# Patient Record
Sex: Male | Born: 1960 | Race: Black or African American | Hispanic: No | Marital: Married | Smoking: Former smoker
Health system: Southern US, Community
[De-identification: ages and names within clinical notes are randomized; demographics above are authoritative.]

## PROBLEM LIST (undated history)

## (undated) DIAGNOSIS — I10 Essential (primary) hypertension: Secondary | ICD-10-CM

## (undated) HISTORY — PX: SHOULDER SURGERY: SHX246

## (undated) HISTORY — PX: BACK SURGERY: SHX140

## (undated) HISTORY — PX: TONSILLECTOMY: SUR1361

---

## 2004-09-26 ENCOUNTER — Ambulatory Visit (HOSPITAL_BASED_OUTPATIENT_CLINIC_OR_DEPARTMENT_OTHER): Admission: RE | Admit: 2004-09-26 | Discharge: 2004-09-26 | Payer: Self-pay | Admitting: Orthopedic Surgery

## 2004-09-26 ENCOUNTER — Ambulatory Visit (HOSPITAL_COMMUNITY): Admission: RE | Admit: 2004-09-26 | Discharge: 2004-09-26 | Payer: Self-pay | Admitting: Orthopedic Surgery

## 2005-11-07 ENCOUNTER — Encounter: Admission: RE | Admit: 2005-11-07 | Discharge: 2005-11-07 | Payer: Self-pay | Admitting: Orthopedic Surgery

## 2005-11-19 ENCOUNTER — Inpatient Hospital Stay (HOSPITAL_COMMUNITY): Admission: RE | Admit: 2005-11-19 | Discharge: 2005-11-21 | Payer: Self-pay | Admitting: Orthopedic Surgery

## 2007-06-02 IMAGING — CR DG CHEST 2V
2 series · 2 of 2 positions shown · non-contrast
Comparison: none

CLINICAL DATA: Preadmission workup for HNP.  Hypertension.  
 CHEST - 2 VIEWS ? 11/15/05: 
 No priors for comparison.

[view not recorded (1 of 2)]
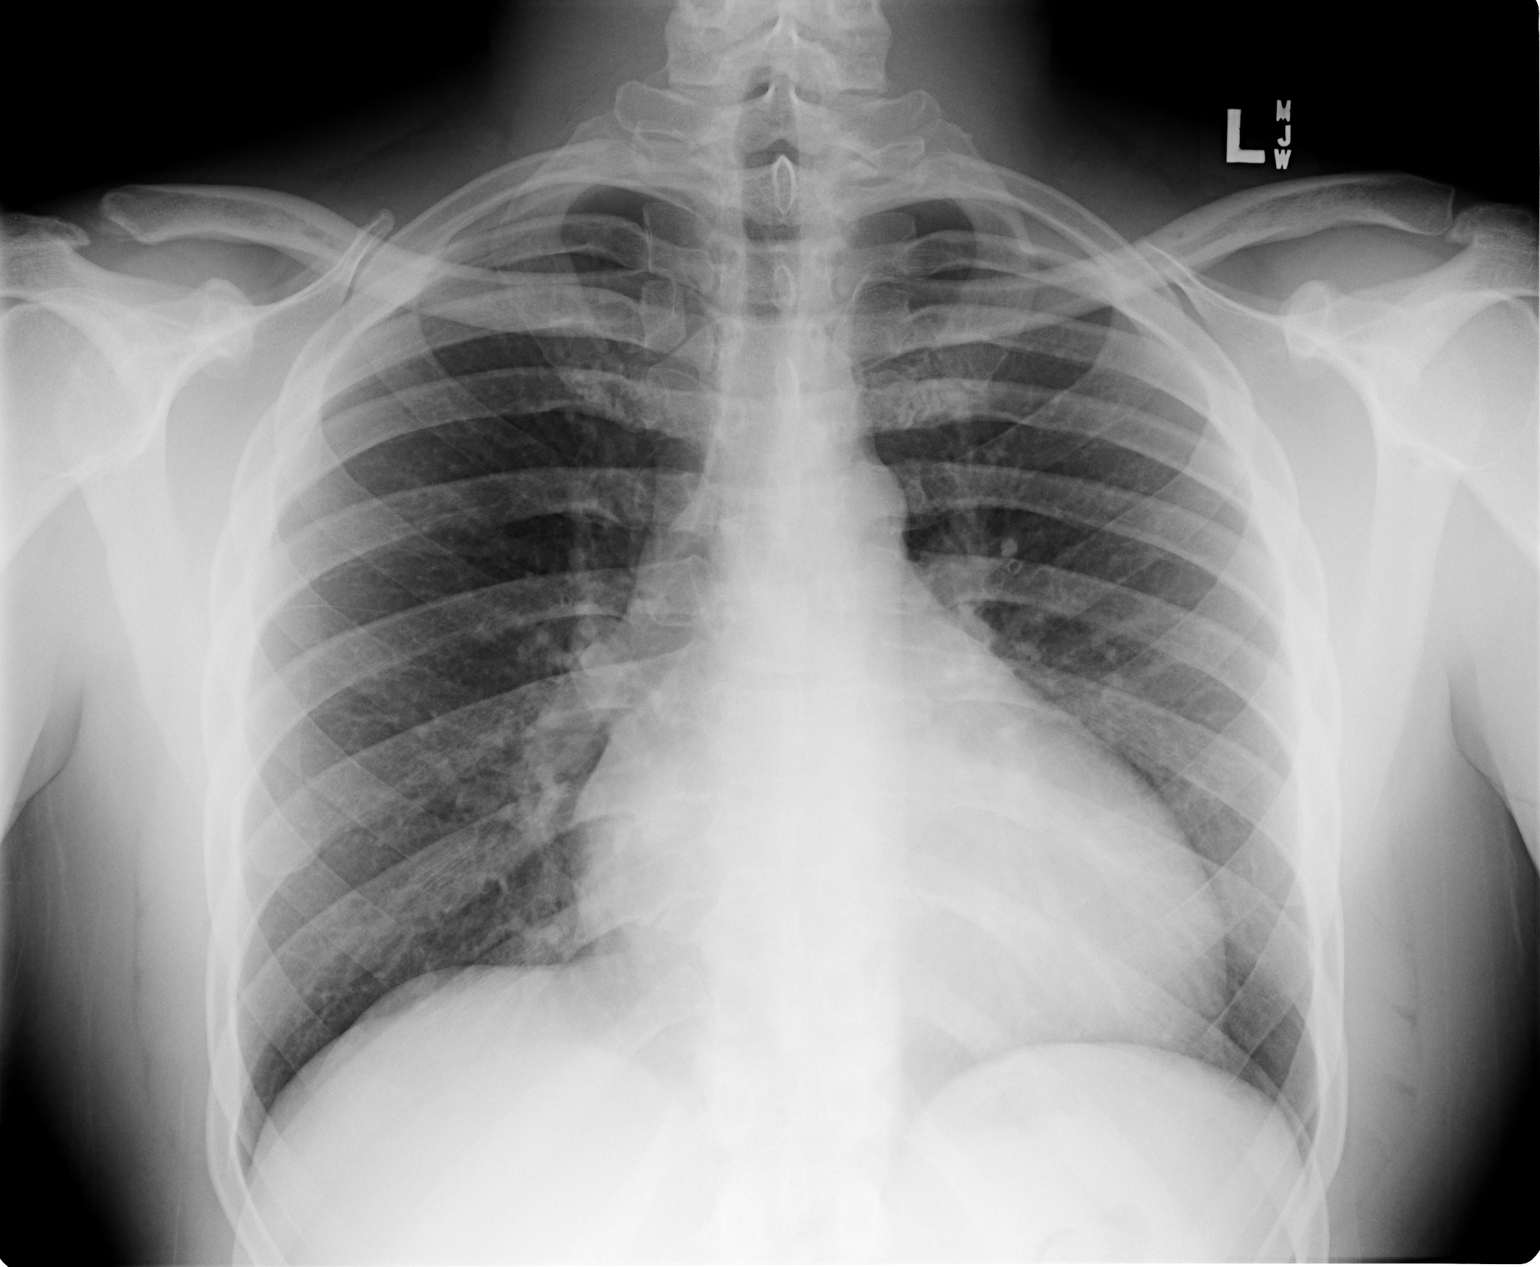

[view not recorded (2 of 2)]
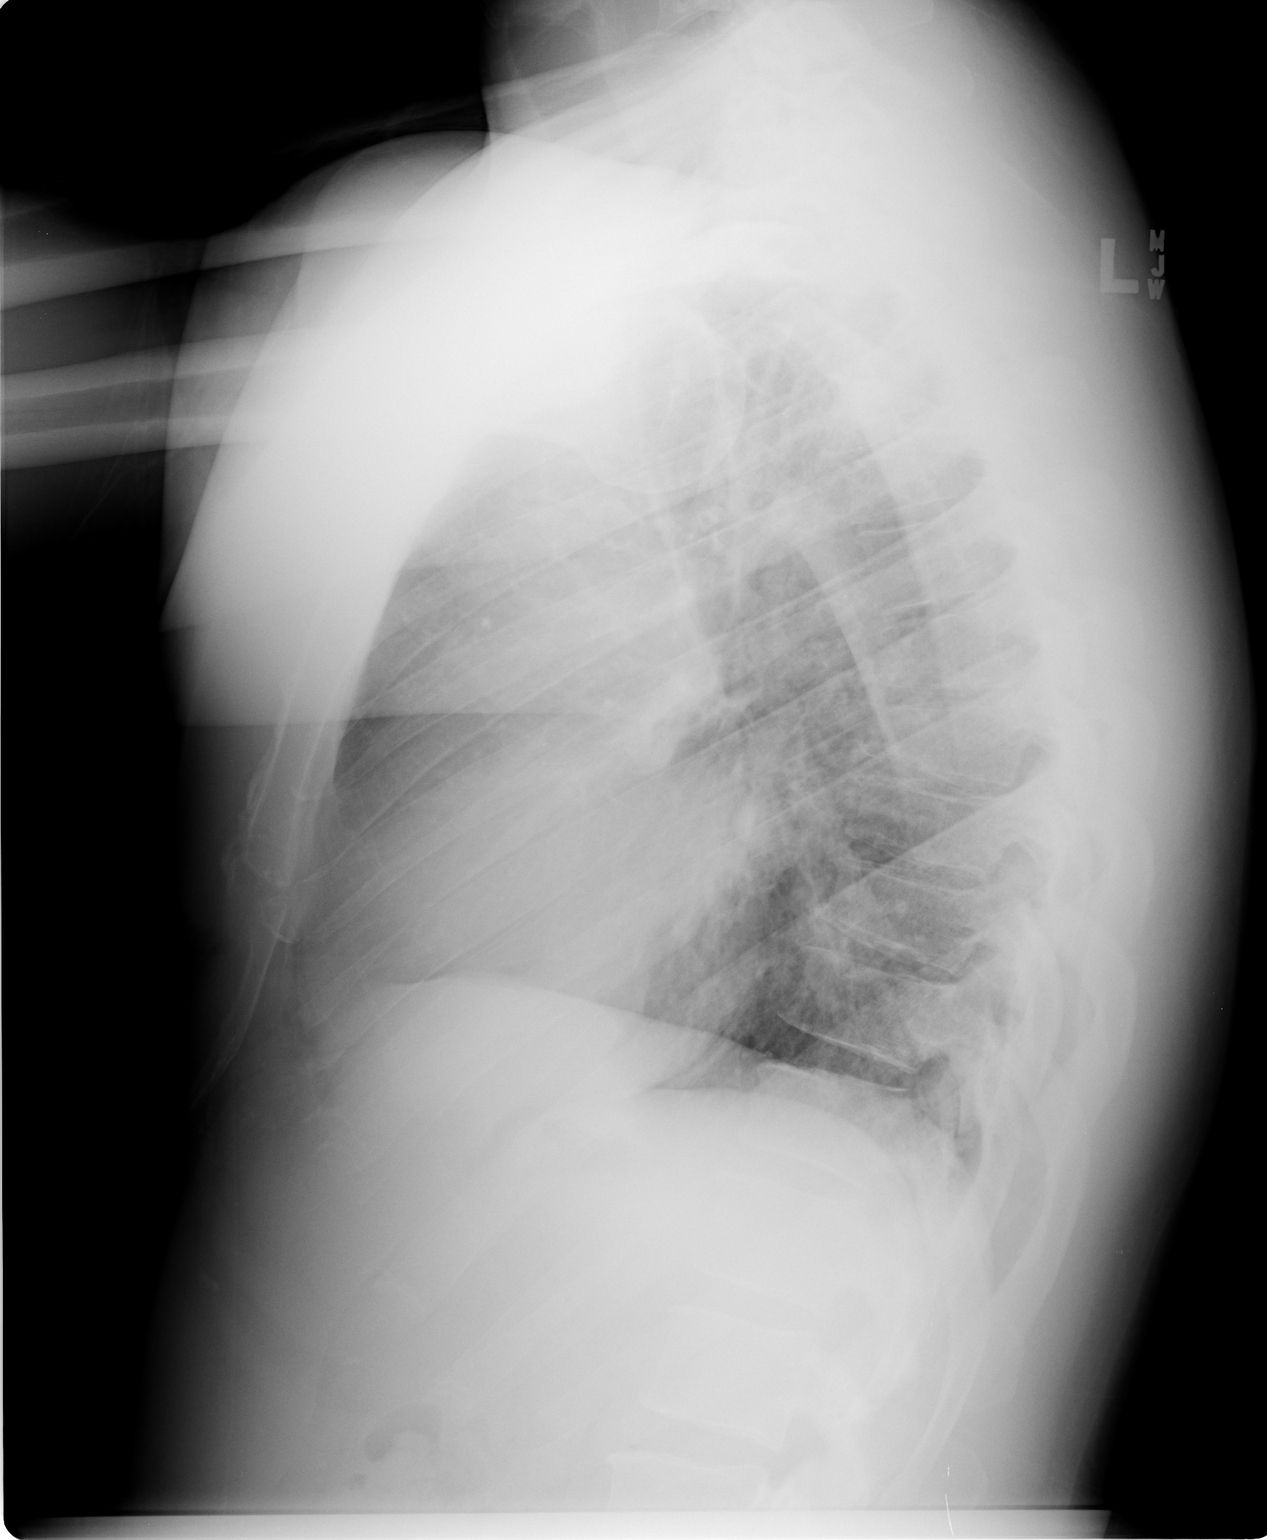

[2 of 2 positions shown; findings below may reference images not displayed]

FINDINGS: Cardiomegaly.  No congestive heart failure or acute lung process.
IMPRESSION: Cardiomegaly.

## 2007-06-06 IMAGING — CR DG LUMBAR SPINE 2-3V
1 series · 1 of 1 positions shown · non-contrast
Comparison: none

CLINICAL DATA: Localization for discectomy. 
 LUMBAR SPINE ? 2 VIEW:

[view not recorded]
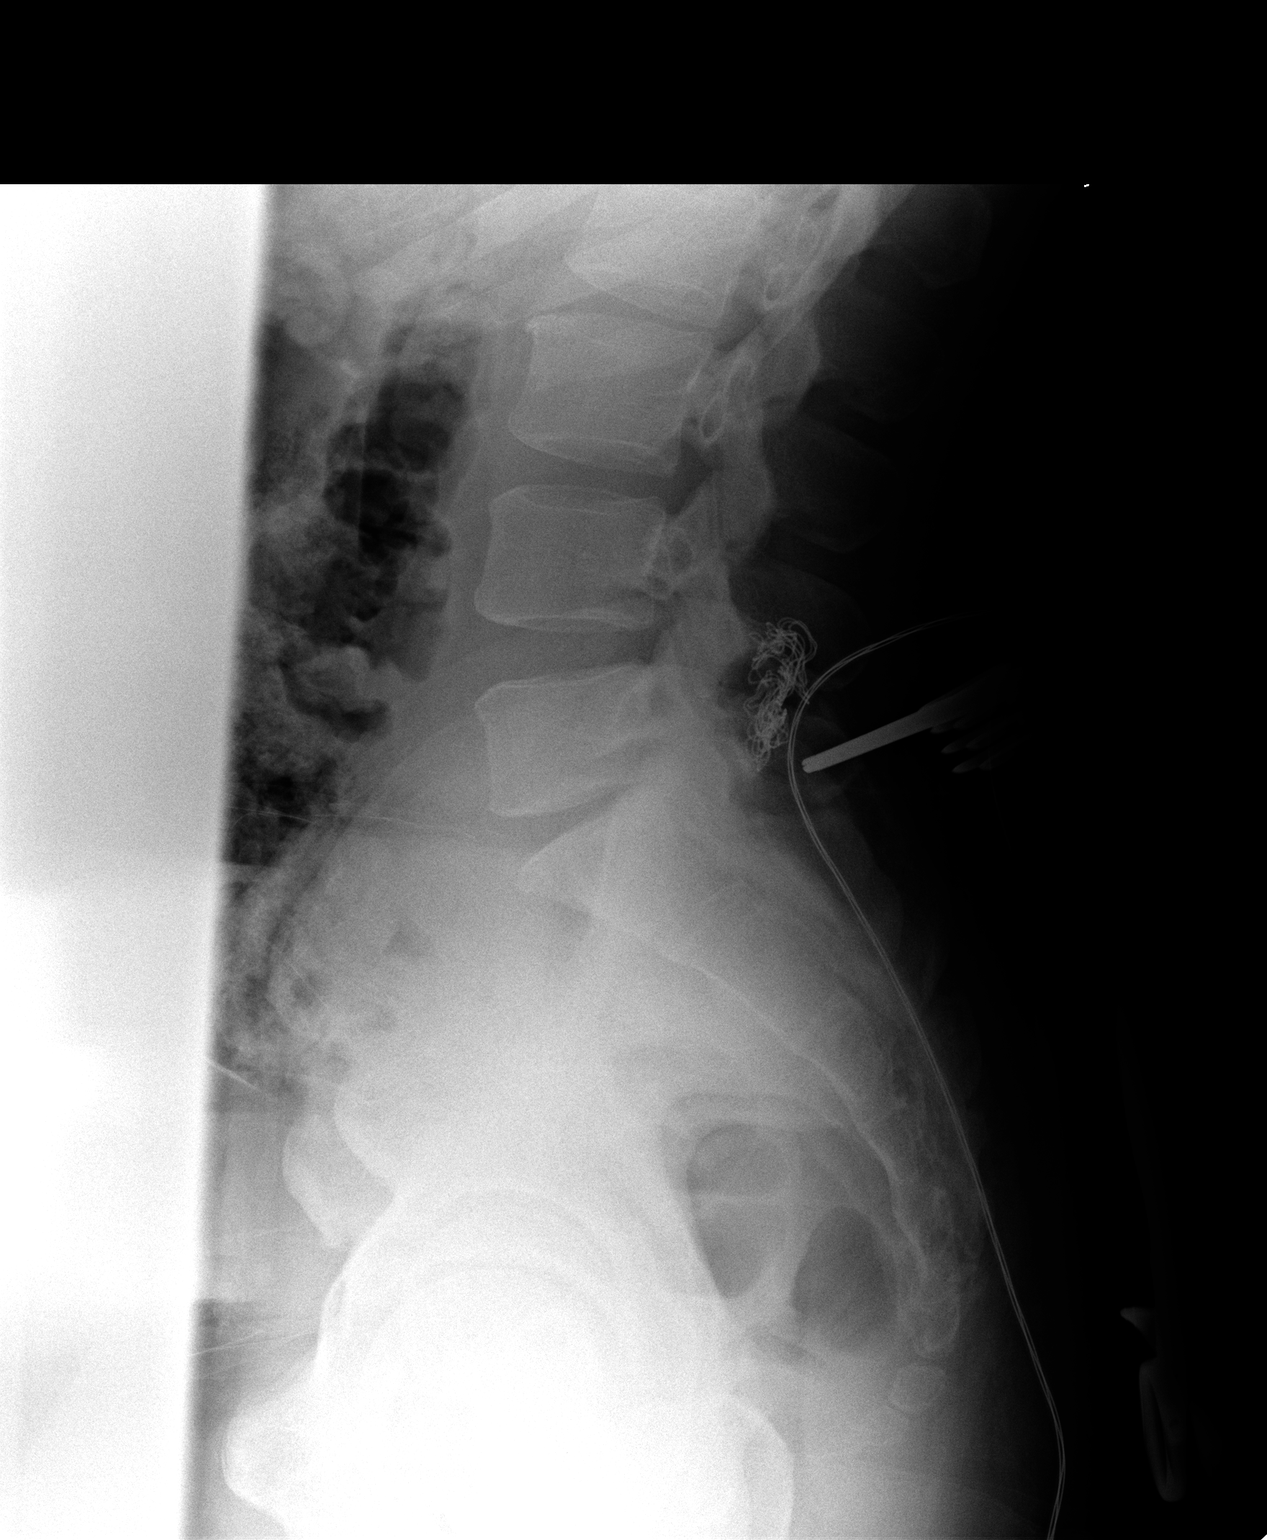

[1 of 1 positions shown; findings below may reference images not displayed]

FINDINGS: Instruments are posterior to the neural canal at L5-S1.
IMPRESSION: Instruments at L5-S1. 
 LUMBAR SPINE #2, 11/19/05, 4141 HOURS:
FINDINGS: A needle overlies the L4-5 disc space near the center.
IMPRESSION: The L4-5 disc space is localized.

## 2017-01-14 ENCOUNTER — Ambulatory Visit: Payer: BLUE CROSS/BLUE SHIELD | Attending: Internal Medicine | Admitting: Physical Therapy

## 2017-01-14 DIAGNOSIS — R293 Abnormal posture: Secondary | ICD-10-CM | POA: Diagnosis present

## 2017-01-14 DIAGNOSIS — M5441 Lumbago with sciatica, right side: Secondary | ICD-10-CM | POA: Diagnosis present

## 2017-01-14 DIAGNOSIS — M5442 Lumbago with sciatica, left side: Secondary | ICD-10-CM | POA: Insufficient documentation

## 2017-01-14 DIAGNOSIS — G8929 Other chronic pain: Secondary | ICD-10-CM | POA: Insufficient documentation

## 2017-01-14 NOTE — Therapy (Signed)
[facility] [address], [address] [place], [place], [address] Phone: [phone]   Fax:  [phone]  Physical Therapy Evaluation  Patient Details  Name: Terrence Newton MRN: [id number] Date of Birth: 30-May-1960 Referring Provider: Dr. Salli Real  Encounter Date: 01/14/2017      PT End of Session - 01/14/17 1229    Visit Number 1   Number of Visits 12   Date for PT Re-Evaluation 02/25/17   Authorization Type [employer] $25 copay; 60 visit limit   PT Start Time 1152   PT Stop Time 1225   PT Time Calculation (min) 33 min   Activity Tolerance Patient tolerated treatment well   Behavior During Therapy Restless      No past medical history on file.  No past surgical history on file.  There were no vitals filed for this visit.       Subjective Assessment - 01/14/17 1155    Subjective Pt is a 56 y/o male who presents to OPPT for chronic LBP, which exacerbated putting pt out of work since November 29, 2016.  Pt declines specific MOI, but reports work responsibilities include bending and lifting.  Pt presents today with continued LBP affecting functional mobility.  pt to bring med list next visit   Pertinent History laminectomy (laser and orthopedic) L5-S1 x 2 (2004, 2007)   Limitations Lifting;House hold activities;Walking;Standing;Sitting   How long can you sit comfortably? unable (pt restless but sitting throughout subjective of eval)   How long can you stand comfortably? "very little"   How long can you walk comfortably? "couple hundred feet"   Diagnostic tests CT scan: DDD, narrowing   Patient Stated Goals improve pain and functional, wants to be able to work for a couple hours   Currently in Pain? Yes   Pain Score 7   up to 9/10; at best 4/10   Pain Location Back   Pain Orientation Lower;Left;Right  "alternates" sides   Pain Type Chronic pain   Pain Radiating Towards into bil LEs into feet (2nd and 3rd toe specifically)   Pain Onset More than a month ago   Pain Frequency Constant   Aggravating Factors  "everything"   Pain Relieving Factors lying down alternating heat, ice; meds            [facility] PT Assessment - 01/14/17 1203      Assessment   Medical Diagnosis LBP   Referring Provider Dr. Salli Real   Onset Date/Surgical Date 11/29/16   Next MD Visit 01/23/17   Prior Therapy previously following back surgeries     Precautions   Precautions None     Restrictions   Weight Bearing Restrictions No     Balance Screen   Has the patient fallen in the past 6 months No   Has the patient had a decrease in activity level because of a fear of falling?  No   Is the patient reluctant to leave their home because of a fear of falling?  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, 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, bending   Leisure fishing     Observation/Other Assessments   Focus on Therapeutic Outcomes (FOTO)  25(75% limited; predicted 54% limited)     Posture/Postural Control   Posture/Postural Control Postural limitations   Postural Limitations Rounded Shoulders;Forward head     ROM / Strength   AROM / PROM / Strength AROM;Strength     AROM   AROM Assessment Site Lumbar   Lumbar Flexion 57  had to "walk" hands back up to stand upright   Lumbar Extension 15   Lumbar - Right Side Bend 14   Lumbar - Left Side Bend 40     Strength    Strength Assessment Site Hip;Knee;Ankle   Right/Left Hip Right;Left   Right Hip Flexion 5/5   Right Hip Extension 4/5  pain   Right Hip ABduction 5/5   Left Hip Flexion 5/5   Left Hip Extension 4/5  pain   Left Hip ABduction 5/5   Right/Left Knee Right;Left   Right Knee Flexion 5/5   Right Knee Extension 5/5   Left Knee Flexion 5/5   Left Knee Extension 5/5   Right/Left Ankle Right;Left   Right Ankle Dorsiflexion 5/5   Left Ankle Dorsiflexion 5/5     Flexibility   Soft Tissue Assessment /Muscle Length yes   Hamstrings min tightness bil   Quadriceps mild tightness bil   Piriformis tightness bil     Palpation   Palpation comment tenderness Lt L4/5; trigger points noted in glut med on Lt     Special Tests    Special Tests Lumbar   Lumbar Tests Straight Leg Raise     Straight Leg Raise   Findings Negative            Objective measurements completed on examination: See above findings.          [facility] Adult PT Treatment/Exercise - 01/14/17 1203      Exercises   Exercises Lumbar     Lumbar Exercises: Stretches   Piriformis Stretch 1 rep;30 seconds   Piriformis Stretch Limitations for HEP instruction     Lumbar Exercises: Sidelying   Other Sidelying Lumbar Exercises sidelying quad stretch x 1 rep for HEP instruction - instructed in sidelying and prone options                PT Education - 01/14/17 1229    Education provided Yes   Education Details HEP, TDN   Person(s) Educated Patient   Methods Explanation;Demonstration;Handout   Comprehension Verbalized understanding;Returned demonstration;Need further instruction             PT Long Term Goals - 01/14/17 1353      PT LONG TERM GOAL #1   Title independent with HEP   Time 6   Period Weeks   Status New   Target Date 02/25/17     PT LONG TERM GOAL #2   Title verbalize understanding of posture/body mechanics to decrease risk of reinjury   Time 6   Period Weeks   Status New    Target Date 02/25/17     PT LONG TERM GOAL #3   Title demonstrate ability to walk > 500' without increase in pain for improved function   Time 6   Period Weeks   Status New   Target Date 02/25/17  PT LONG TERM GOAL #4   Title report pain < 4/10 with activity for improved function   Time 6   Period Weeks   Status New   Target Date 02/25/17     PT LONG TERM GOAL #5   Title perform lumbar flexion without increase in pain for improved mobility and in order to perform job responsibilities   Time 6   Period Weeks   Status New   Target Date 02/25/17                Plan - 01/14/17 1350    Clinical Impression Statement Pt is a 56 y/o male who presents to OPPT for LBP radiating into bil lower extremities.  Pt very restless throughout initial eval with constant movement of bil legs and frequent repositioning.  Pt demonstrates mild strength deficits, and impaired flexibility with poor core stability and pain affecting functional mobilty.  Will benefit from PT to address these deficits.   History and Personal Factors relevant to plan of care: unable to work due to pain   Clinical Presentation Evolving   Clinical Presentation due to: minimal alleviating factors, with most activity aggravating pain; worsening symptoms   Clinical Decision Making Moderate   Rehab Potential Fair   PT Frequency 2x / week   PT Duration 6 weeks   PT Treatment/Interventions ADLs/Self Care Home Management;Cryotherapy;Electrical Stimulation;Moist Heat;Traction;Ultrasound;Balance training;Therapeutic exercise;Therapeutic activities;Functional mobility training;Gait training;Stair training;Patient/family education;Manual techniques;Taping;Dry needling   PT Next Visit Plan possible DN to gluts/piriformis; review HEP, add core and hip ext strengthening   Consulted and Agree with Plan of Care Patient      Patient will benefit from skilled therapeutic intervention in order to improve the following deficits and  impairments:  Impaired flexibility, Postural dysfunction, Decreased range of motion, Difficulty walking, Decreased mobility, Decreased strength, Increased fascial restricitons, Increased muscle spasms, Pain  Visit Diagnosis: Chronic bilateral low back pain with bilateral sciatica - Plan: PT plan of care cert/re-cert  Abnormal posture - Plan: PT plan of care cert/re-cert     Problem List There are no active problems to display for this patient.     Clarita Crane, PT, DPT 01/14/17 1:57 PM    [facility] [address] [place], [place], [address] Phone: [phone]   Fax:  [phone]  Name: Terrence Newton MRN: [id number] Date of Birth: [birth year]

## 2017-01-14 NOTE — Patient Instructions (Signed)
Extensors / Rotators, Supine    Lie supine, one leg straight, other leg bent, knee held by opposite hand. Gently pull knee toward opposite shoulder. Feel stretch in buttocks and outside of hip. Hold _30__ seconds. Repeat _2-3__ times per session. Do _2-3__ sessions per day.  KNEE: Quadriceps - Prone    Place strap around ankle. Bring ankle toward buttocks. Press hip into surface. Hold _30__ seconds. _2-3__ reps per set, _2-3_ sets per day, _7__ days per week       Trigger Point Dry Needling  . What is Trigger Point Dry Needling (DN)? o DN is a physical therapy technique used to treat muscle pain and dysfunction. Specifically, DN helps deactivate muscle trigger points (muscle knots).  o A thin filiform needle is used to penetrate the skin and stimulate the underlying trigger point. The goal is for a local twitch response (LTR) to occur and for the trigger point to relax. No medication of any kind is injected during the procedure.   . What Does Trigger Point Dry Needling Feel Like?  o The procedure feels different for each individual patient. Some patients report that they do not actually feel the needle enter the skin and overall the process is not painful. Very mild bleeding may occur. However, many patients feel a deep cramping in the muscle in which the needle was inserted. This is the local twitch response.   Marland Kitchen How Will I feel after the treatment? o Soreness is normal, and the onset of soreness may not occur for a few hours. Typically this soreness does not last longer than two days.  o Bruising is uncommon, however; ice can be used to decrease any possible bruising.  o In rare cases feeling tired or nauseous after the treatment is normal. In addition, your symptoms may get worse before they get better, this period will typically not last longer than 24 hours.   . What Can I do After My Treatment? o Increase your hydration by drinking more water for the next 24 hours. o You may  place ice or heat on the areas treated that have become sore, however, do not use heat on inflamed or bruised areas. Heat often brings more relief post needling. o You can continue your regular activities, but vigorous activity is not recommended initially after the treatment for 24 hours. o DN is best combined with other physical therapy such as strengthening, stretching, and other therapies.

## 2017-01-15 ENCOUNTER — Ambulatory Visit: Payer: BLUE CROSS/BLUE SHIELD | Admitting: Physical Therapy

## 2017-01-15 DIAGNOSIS — G8929 Other chronic pain: Secondary | ICD-10-CM

## 2017-01-15 DIAGNOSIS — M5442 Lumbago with sciatica, left side: Principal | ICD-10-CM

## 2017-01-15 DIAGNOSIS — M5441 Lumbago with sciatica, right side: Principal | ICD-10-CM

## 2017-01-15 DIAGNOSIS — R293 Abnormal posture: Secondary | ICD-10-CM

## 2017-01-15 NOTE — Therapy (Signed)
[facility] [address] [place], [place], [address] Phone: [phone]   Fax:  [phone]  Physical Therapy Treatment  Patient Details  Name: Terrence Newton MRN: [id number] Date of Birth: 1960/08/12 Referring Provider: Dr. Salli Real  Encounter Date: 01/15/2017      PT End of Session - 01/15/17 1500    Visit Number 2   Number of Visits 12   Date for PT Re-Evaluation 02/25/17   Authorization Type [employer] $25 copay; 60 visit limit   PT Start Time 1400   PT Stop Time 1448   PT Time Calculation (min) 48 min   Activity Tolerance Patient tolerated treatment well   Behavior During Therapy [facility] for tasks assessed/performed  less restless today      No past medical history on file.  No past surgical history on file.  There were no vitals filed for this visit.      Subjective Assessment - 01/15/17 1404    Subjective doing well; had a chance to do exercises - feels a little pain in front of hip with piriformis stretch   Pertinent History laminectomy (laser and orthopedic) L5-S1 x 2 (2004, 2007)   Diagnostic tests CT scan: multilevel spondylosis with facet arthropathy; mild central canal and foraminal narrowing L4-5; foraminal narrowing at L5-S1   Patient Stated Goals improve pain and functional, wants to be able to work for a couple hours   Currently in Pain? Yes   Pain Score 4    Pain Location Back   Pain Orientation Left;Right;Lower   Pain Type Chronic pain   Pain Onset More than a month ago   Pain Frequency Constant   Aggravating Factors  "everything"   Pain Relieving Factors lying down alternating heat, ice; meds            [facility] PT Assessment - 01/14/17 1203      Assessment   Medical Diagnosis LBP   Referring Provider Dr. Salli Real   Onset Date/Surgical Date 11/29/16   Next MD Visit 01/23/17   Prior Therapy previously following back surgeries     Precautions   Precautions None     Restrictions    Weight Bearing Restrictions No     Balance Screen   Has the patient fallen in the past 6 months No   Has the patient had a decrease in activity level because of a fear of falling?  No   Is the patient reluctant to leave their home because of a fear of falling?  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, 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, bending   Leisure fishing     Observation/Other Assessments   Focus on Therapeutic Outcomes (FOTO)  25(75% limited; predicted 54% limited)     Posture/Postural Control   Posture/Postural Control Postural limitations   Postural Limitations Rounded Shoulders;Forward head     ROM / Strength  AROM / PROM / Strength AROM;Strength     AROM   AROM Assessment Site Lumbar   Lumbar Flexion 57  had to "walk" hands back up to stand upright   Lumbar Extension 15   Lumbar - Right Side Bend 14   Lumbar - Left Side Bend 40     Strength   Strength Assessment Site Hip;Knee;Ankle   Right/Left Hip Right;Left   Right Hip Flexion 5/5   Right Hip Extension 4/5  pain   Right Hip ABduction 5/5   Left Hip Flexion 5/5   Left Hip Extension 4/5  pain   Left Hip ABduction 5/5   Right/Left Knee Right;Left   Right Knee Flexion 5/5   Right Knee Extension 5/5   Left Knee Flexion 5/5   Left Knee Extension 5/5   Right/Left Ankle Right;Left   Right Ankle Dorsiflexion 5/5   Left Ankle Dorsiflexion 5/5      Flexibility   Soft Tissue Assessment /Muscle Length yes   Hamstrings min tightness bil   Quadriceps mild tightness bil   Piriformis tightness bil     Palpation   Palpation comment tenderness Lt L4/5; trigger points noted in glut med on Lt     Special Tests    Special Tests Lumbar   Lumbar Tests Straight Leg Raise     Straight Leg Raise   Findings Negative                     [facility] Adult PT Treatment/Exercise - 01/15/17 1408      Self-Care   Self-Care Other Self-Care Comments   Other Self-Care Comments  instructed in use of ball for self manual myofascial release: pt performed to bil glutes in standing     Lumbar Exercises: Stretches   Lobbyist 3 reps;30 seconds   Quad Stretch Limitations prone   Piriformis Stretch 3 reps;30 seconds     Lumbar Exercises: Aerobic   Stationary Bike NuStep L1 x 6 min     Lumbar Exercises: Supine   Ab Set 10 reps;5 seconds   Bridge 10 reps;5 seconds     Manual Therapy   Manual Therapy Joint mobilization;Soft tissue mobilization;Myofascial release   Joint Mobilization Lt hip long axis distraction   Soft tissue mobilization Lt glut med/min/max in sidelying and prone   Myofascial Release Lt glut med/min/max in sidelying and prone          Trigger Point Dry Needling - 01/15/17 1444    Consent Given? Yes   Education Handout Provided Yes   Muscles Treated Lower Body Gluteus minimus;Gluteus maximus   Gluteus Maximus Response Twitch response elicited;Palpable increased muscle length   Gluteus Minimus Response Twitch response elicited;Palpable increased muscle length              PT Education - 01/15/17 1459    Education provided Yes   Education Details HEP, reviewed DN handout   Person(s) Educated Patient   Methods Explanation;Demonstration;Handout   Comprehension Verbalized understanding;Returned demonstration;Need further instruction             PT Long Term Goals - 01/14/17 1353      PT LONG TERM  GOAL #1   Title independent with HEP   Time 6   Period Weeks   Status New   Target Date 02/25/17     PT LONG TERM GOAL #2   Title verbalize understanding of posture/body mechanics to decrease risk of reinjury   Time 6   Period Weeks  Status New   Target Date 02/25/17     PT LONG TERM GOAL #3   Title demonstrate ability to walk > 500' without increase in pain for improved function   Time 6   Period Weeks   Status New   Target Date 02/25/17     PT LONG TERM GOAL #4   Title report pain < 4/10 with activity for improved function   Time 6   Period Weeks   Status New   Target Date 02/25/17     PT LONG TERM GOAL #5   Title perform lumbar flexion without increase in pain for improved mobility and in order to perform job responsibilities   Time 6   Period Weeks   Status New   Target Date 02/25/17               Plan - 01/15/17 1500    Clinical Impression Statement Pt arrived to PT today with decreased pain compared to eval; and tolerated session well today.  Initiated DN to glut min/max but pt only able to tolerate 2 needle sticks, but good twitch responses noted with both with decreased trigger points and taut bands noted with palpation following.  Pt will benefit from PT to maximize function.   PT Treatment/Interventions ADLs/Self Care Home Management;Cryotherapy;Electrical Stimulation;Moist Heat;Traction;Ultrasound;Balance training;Therapeutic exercise;Therapeutic activities;Functional mobility training;Gait training;Stair training;Patient/family education;Manual techniques;Taping;Dry needling   PT Next Visit Plan assess response to DN; review HEP, add core and hip ext strengthening   Consulted and Agree with Plan of Care Patient      Patient will benefit from skilled therapeutic intervention in order to improve the following deficits and impairments:  Impaired flexibility, Postural dysfunction, Decreased range of motion, Difficulty walking, Decreased mobility, Decreased  strength, Increased fascial restricitons, Increased muscle spasms, Pain  Visit Diagnosis: Chronic bilateral low back pain with bilateral sciatica  Abnormal posture     Problem List There are no active problems to display for this patient.     Clarita Crane, PT, DPT 01/15/17 3:04 PM    [facility] [address], [address] [place], [place], [address] Phone: [phone]   Fax:  [phone]  Name: Terrence Newton MRN: [id number] Date of Birth: 03/10/1961

## 2017-01-15 NOTE — Patient Instructions (Signed)
Pelvic Tilt    Flatten back by tightening stomach muscles and buttocks.  Hold for 5 seconds. Repeat __10__ times per set. Do _1___ sets per session. Do _2-3___ sessions per day.  Bridging    Slowly raise buttocks from floor, keeping stomach tight.  Hold for 5 seconds. Repeat __10__ times per set. Do __1__ sets per session. Do _2-3___ sessions per day.     Ball at [employer]: Go in the store and head towards the greeting cards.  Turn at the cards and head towards the back of the store.  Just before the toys there's an end cap on the left that has "big kids party favors." Find the softball size ball that lights up when you hit it.  This is the one to get.

## 2017-01-22 ENCOUNTER — Ambulatory Visit: Payer: BLUE CROSS/BLUE SHIELD | Attending: Internal Medicine | Admitting: Physical Therapy

## 2017-01-22 ENCOUNTER — Encounter: Payer: Self-pay | Admitting: Physical Therapy

## 2017-01-22 DIAGNOSIS — R293 Abnormal posture: Secondary | ICD-10-CM | POA: Diagnosis present

## 2017-01-22 DIAGNOSIS — G8929 Other chronic pain: Secondary | ICD-10-CM | POA: Insufficient documentation

## 2017-01-22 DIAGNOSIS — M5442 Lumbago with sciatica, left side: Secondary | ICD-10-CM | POA: Insufficient documentation

## 2017-01-22 DIAGNOSIS — M5441 Lumbago with sciatica, right side: Secondary | ICD-10-CM | POA: Insufficient documentation

## 2017-01-22 NOTE — Therapy (Signed)
[facility] [address], [address] [place], [place], [address] Phone: [phone]   Fax:  [phone]  Physical Therapy Treatment  Patient Details  Name: Dianah FieldJacques Coover MRN: [id number] Date of Birth: 08-04-1960 Referring Provider: Dr. Salli RealYun Sun  Encounter Date: 01/22/2017      PT End of Session - 01/22/17 0942    Visit Number 3   Number of Visits 12   Date for PT Re-Evaluation 02/25/17   Authorization Type BCBS $25 copay; 60 visit limit   PT Start Time 0941  Pt late   PT Stop Time 1015   PT Time Calculation (min) 34 min   Activity Tolerance Patient limited by pain   Behavior During Therapy Restless      History reviewed. No pertinent past medical history.  History reviewed. No pertinent surgical history.  There were no vitals filed for this visit.      Subjective Assessment - 01/22/17 0944    Subjective My pain has been worse since last sesison. LT knee did not tolerate the prone stretch and my back does not like laying prone.    Currently in Pain? Yes   Pain Score 5    Pain Location Back   Pain Orientation Right;Left;Lower  Down into LT knee   Pain Descriptors / Indicators Shooting;Sharp;Tender   Aggravating Factors  doing anything   Pain Relieving Factors laying flat at times, meds maybe   Multiple Pain Sites No                         [facility] Adult PT Treatment/Exercise - 01/22/17 0001      Lumbar Exercises: Stretches   Single Knee to Chest Stretch --  Attempted; stopped due to significant pain increase     Moist Heat Therapy   Number Minutes Moist Heat 25 Minutes   Moist Heat Location Lumbar Spine     Electrical Stimulation   Electrical Stimulation Location lumbar   Electrical Stimulation Action IFC   Electrical Stimulation Parameters 80-150 HZ hooklying   Electrical Stimulation Goals Pain                     PT Long Term Goals - 01/14/17 1353      PT LONG TERM GOAL  #1   Title independent with HEP   Time 6   Period Weeks   Status New   Target Date 02/25/17     PT LONG TERM GOAL #2   Title verbalize understanding of posture/body mechanics to decrease risk of reinjury   Time 6   Period Weeks   Status New   Target Date 02/25/17     PT LONG TERM GOAL #3   Title demonstrate ability to walk > 500' without increase in pain for improved function   Time 6   Period Weeks   Status New   Target Date 02/25/17     PT LONG TERM GOAL #4   Title report pain < 4/10 with activity for improved function   Time 6   Period Weeks   Status New   Target Date 02/25/17     PT LONG TERM GOAL #5   Title perform lumbar flexion without increase in pain for improved mobility and in order to perform job responsibilities   Time 6   Period Weeks   Status New   Target Date 02/25/17  Plan - 01/22/17 0942    Clinical Impression Statement Pt presents today with reports of elevated pain in his back and LT LE to his knee. He did not tolerate any mat work . He wrythed around the mat in pain after single knee to chest stretch. No other exercises were attempted as pain was reported to be a 8-9/10 at that time.  Tried Estim to reduce pain i nhooklying position.    Rehab Potential Fair   PT Frequency 2x / week   PT Duration 6 weeks   PT Treatment/Interventions ADLs/Self Care Home Management;Cryotherapy;Electrical Stimulation;Moist Heat;Traction;Ultrasound;Balance training;Therapeutic exercise;Therapeutic activities;Functional mobility training;Gait training;Stair training;Patient/family education;Manual techniques;Taping;Dry needling   PT Next Visit Plan Assess pain, progress to core strength if he can tolerate. Pt mentioned wanting to try another doctor, either an Ortho or Neuro MD.    Consulted and Agree with Plan of Care Patient      Patient will benefit from skilled therapeutic intervention in order to improve the following deficits and impairments:   Impaired flexibility, Postural dysfunction, Decreased range of motion, Difficulty walking, Decreased mobility, Decreased strength, Increased fascial restricitons, Increased muscle spasms, Pain  Visit Diagnosis: Chronic bilateral low back pain with bilateral sciatica  Abnormal posture     Problem List There are no active problems to display for this patient.   Rjay Revolorio, PTA 01/22/2017, 11:17 AM  [facility] [address], [address] [place], [place], [address] Phone: [phone]   Fax:  [phone]  Name: Jyles Sontag MRN: [id number] Date of Birth: 12/23/1960

## 2017-01-24 ENCOUNTER — Encounter: Payer: Self-pay | Admitting: Physical Medicine & Rehabilitation

## 2017-01-27 ENCOUNTER — Ambulatory Visit: Payer: BLUE CROSS/BLUE SHIELD | Admitting: Physical Therapy

## 2017-01-27 ENCOUNTER — Encounter: Payer: Self-pay | Admitting: Physical Therapy

## 2017-01-27 DIAGNOSIS — M5442 Lumbago with sciatica, left side: Principal | ICD-10-CM

## 2017-01-27 DIAGNOSIS — R293 Abnormal posture: Secondary | ICD-10-CM

## 2017-01-27 DIAGNOSIS — G8929 Other chronic pain: Secondary | ICD-10-CM

## 2017-01-27 DIAGNOSIS — M5441 Lumbago with sciatica, right side: Principal | ICD-10-CM

## 2017-01-27 NOTE — Therapy (Signed)
[facility] [address] [place], [place], [address] Phone: [phone]   Fax:  [phone]  Physical Therapy Treatment  Patient Details  Name: Albion Weatherholtz MRN: [id number] Date of Birth: 06-02-1960 Referring Provider: Dr. Salli Real  Encounter Date: 01/27/2017      PT End of Session - 01/27/17 1059    Visit Number 4   Number of Visits 12   Date for PT Re-Evaluation 02/25/17   Authorization Type BCBS $25 copay; 60 visit limit   PT Start Time 1059   PT Stop Time 1146   PT Time Calculation (min) 47 min   Activity Tolerance Patient limited by pain  cramping/spasms   Behavior During Therapy Restless      History reviewed. No pertinent past medical history.  History reviewed. No pertinent surgical history.  There were no vitals filed for this visit.      Subjective Assessment - 01/27/17 1116    Subjective I had a lot of pain and passed out last Thursday. I bought the TENS unit and my muscles were twitch.  Flexion seems to feel good.  Reports more pain when constipated but not usually a problem.  Denies urinary dysfunction.     Pertinent History laminectomy (laser and orthopedic) L5-S1 x 2 (2004, 2007)   Limitations Lifting;House hold activities;Walking;Standing;Sitting   How long can you sit comfortably? unable (pt restless but sitting throughout subjective of eval)   How long can you stand comfortably? "very little"   How long can you walk comfortably? "couple hundred feet"   Diagnostic tests CT scan: multilevel spondylosis with facet arthropathy; mild central canal and foraminal narrowing L4-5; foraminal narrowing at L5-S1   Patient Stated Goals improve pain and functional, wants to be able to work for a couple hours   Currently in Pain? Yes   Pain Score 5    Pain Location Back   Pain Orientation Right;Left;Lower   Pain Descriptors / Indicators Sharp;Shooting   Pain Onset More than a month ago   Pain  Frequency Constant   Aggravating Factors  any moving   Pain Relieving Factors lying down generally, heat   Multiple Pain Sites No                         [facility] Adult PT Treatment/Exercise - 01/27/17 0001      Neuro Re-ed    Neuro Re-ed Details  tactile cues to help patient engage TrA and obliques simultaneusly during exercises     Lumbar Exercises: Stretches   Piriformis Stretch 3 reps;30 seconds  knee across and figure 4 in supine     Lumbar Exercises: Supine   Ab Set 10 reps;5 seconds   Clam 10 reps  red band   Other Supine Lumbar Exercises ball squeeze     Manual Therapy   Joint Mobilization Lt hip long axis distraction   Soft tissue mobilization Rt/Lt lumbar paraspinals and glutes in left sidelying                     PT Long Term Goals - 01/27/17 1219      PT LONG TERM GOAL #1   Title independent with HEP   Time 6   Period Weeks   Status On-going     PT LONG TERM GOAL #2   Title verbalize understanding of posture/body mechanics to decrease risk of reinjury   Time 6   Period Weeks   Status  On-going     PT LONG TERM GOAL #3   Title demonstrate ability to walk > 500' without increase in pain for improved function   Time 6   Period Weeks   Status On-going     PT LONG TERM GOAL #4   Title report pain < 4/10 with activity for improved function   Time 6   Period Weeks   Status On-going     PT LONG TERM GOAL #5   Title perform lumbar flexion without increase in pain for improved mobility and in order to perform job responsibilities   Time 6   Period Weeks   Status On-going               Plan - 01/27/17 1059    Clinical Impression Statement Patient was able to begin gentle core strengthening.  he had increased muscle spasms of adductors and TFL with certain movement including bringing knees together to tie band around knees.  Pt had more centralized pain in supine with slight lumbar flexion.  Patient was educated in doing  stretches and working on abdominal bracing in supine and use symptoms as guide.  Skilled PT is needed for improved lumbar and hip strength and stability.     Rehab Potential Fair   PT Treatment/Interventions ADLs/Self Care Home Management;Cryotherapy;Electrical Stimulation;Moist Heat;Traction;Ultrasound;Balance training;Therapeutic exercise;Therapeutic activities;Functional mobility training;Gait training;Stair training;Patient/family education;Manual techniques;Taping;Dry needling   PT Next Visit Plan Continue with gentle core strength, review ball squeeze and clam in supine with engaged core   Consulted and Agree with Plan of Care Patient      Patient will benefit from skilled therapeutic intervention in order to improve the following deficits and impairments:  Impaired flexibility, Postural dysfunction, Decreased range of motion, Difficulty walking, Decreased mobility, Decreased strength, Increased fascial restricitons, Increased muscle spasms, Pain  Visit Diagnosis: Chronic bilateral low back pain with bilateral sciatica  Abnormal posture     Problem List There are no active problems to display for this patient.   Vincente PoliJakki Crosser, PT 01/27/2017, 12:20 PM  [facility] [address], [address] [place], [place], [address] Phone: [phone]   Fax:  [phone]  Name: Dianah FieldJacques Reep MRN: [id number] Date of Birth: [birth year]

## 2017-01-29 ENCOUNTER — Encounter: Payer: BLUE CROSS/BLUE SHIELD | Admitting: Physical Therapy

## 2017-02-03 ENCOUNTER — Encounter: Payer: BLUE CROSS/BLUE SHIELD | Admitting: Physical Therapy

## 2017-02-05 ENCOUNTER — Ambulatory Visit: Payer: BLUE CROSS/BLUE SHIELD | Admitting: Physical Therapy

## 2017-02-05 ENCOUNTER — Encounter: Payer: Self-pay | Admitting: Physical Therapy

## 2017-02-05 DIAGNOSIS — R293 Abnormal posture: Secondary | ICD-10-CM

## 2017-02-05 DIAGNOSIS — M5441 Lumbago with sciatica, right side: Principal | ICD-10-CM

## 2017-02-05 DIAGNOSIS — M5442 Lumbago with sciatica, left side: Principal | ICD-10-CM

## 2017-02-05 DIAGNOSIS — G8929 Other chronic pain: Secondary | ICD-10-CM

## 2017-02-05 NOTE — Patient Instructions (Signed)
   HIP ADDUCTION SQUEEZE - SUPINE  Place a rolled up towel, ball or pillow between your knees and press your knees together so that you squeeze the object firmly and engage your core. Hold 5 seconds and then release and repeat 20x     Transverse Abdominus Activation  Contract your lower abdominals as if you were trying to lift one leg from the table.  Initiate the movement but do no lift foot greater than 1 inch from the table.  Repeat opposite side. Do 2 sets of 10 reps

## 2017-02-05 NOTE — Therapy (Signed)
[facility] [address], [address] [place], [place], [address] Phone: [phone]   Fax:  [phone]  Physical Therapy Treatment  Patient Details  Name: Terrence Newton MRN: [id number] Date of Birth: December 24, 1960 Referring Provider: Dr. Salli Real  Encounter Date: 02/05/2017      PT End of Session - 02/05/17 1144    Visit Number 5   Number of Visits 12   Date for PT Re-Evaluation 02/25/17   Authorization Type [employer]BS $25 copay; 60 visit limit   PT Start Time 1144   PT Stop Time 1228   PT Time Calculation (min) 44 min   Activity Tolerance Patient limited by pain      History reviewed. No pertinent past medical history.  History reviewed. No pertinent surgical history.  There were no vitals filed for this visit.      Subjective Assessment - 02/05/17 1148    Subjective Feeling the same   Patient Stated Goals improve pain and functional, wants to be able to work for a couple hours   Currently in Pain? Yes   Pain Score 7    Pain Location Back   Pain Orientation Left;Right;Lower  mostly left side   Pain Descriptors / Indicators Nagging;Stabbing   Pain Type Chronic pain   Pain Radiating Towards into left leg down to toes   Pain Onset More than a month ago   Pain Frequency Intermittent   Aggravating Factors  doing too much   Pain Relieving Factors heat   Effect of Pain on Daily Activities unable to work   Multiple Pain Sites No                         [facility] Adult PT Treatment/Exercise - 02/05/17 0001      Lumbar Exercises: Supine   Bent Knee Raise 10 reps  bracing core each time   Other Supine Lumbar Exercises low back on foam roll for lumbar traction, hip flexion bilater   Other Supine Lumbar Exercises ball squeeze with core contraction     Manual Therapy   Soft tissue mobilization abdominal massage   Myofascial Release thoracic and pelvic diaphragm release to encourage abdominal contraction                 PT Education - 02/05/17 1230    Education provided Yes   Education Details abdomoninals engaged with LE march and ball squeeze    Person(s) Educated Patient   Methods Explanation;Demonstration;Tactile cues;Verbal cues;Handout   Comprehension Verbalized understanding;Returned demonstration             PT Long Term Goals - 02/05/17 1145      PT LONG TERM GOAL #1   Title independent with HEP   Period Weeks   Status On-going     PT LONG TERM GOAL #2   Title verbalize understanding of posture/body mechanics to decrease risk of reinjury   Time 6   Period Weeks   Status On-going     PT LONG TERM GOAL #3   Title demonstrate ability to walk > 500' without increase in pain for improved function   Time 6   Period Weeks   Status On-going     PT LONG TERM GOAL #4   Title report pain < 4/10 with activity for improved function   Time 6   Period Weeks   Status On-going     PT LONG TERM GOAL #5   Title perform lumbar flexion  without increase in pain for improved mobility and in order to perform job responsibilities   Time 6   Period Weeks   Status On-going               Plan - 02/05/17 1144    Clinical Impression Statement Patient reports no improvements, but not getting worse with current  stretches.  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  Tenderness at those areas and raound pubic bone abdominal attachemnets.  Pt did well engaging core.  He was given exercises to do at home. Pt will need skilled PT for strength and stamina with standing and walking activities to return to work.  Continues to have difficulty with pain management.   PT Treatment/Interventions ADLs/Self Care Home Management;Cryotherapy;Electrical Stimulation;Moist Heat;Traction;Ultrasound;Balance training;Therapeutic exercise;Therapeutic activities;Functional mobility training;Gait training;Stair training;Patient/family education;Manual  techniques;Taping;Dry needling   PT Next Visit Plan Continue with gentle core strength, review ball squeeze and clam in supine with engaged core   Consulted and Agree with Plan of Care Patient      Patient will benefit from skilled therapeutic intervention in order to improve the following deficits and impairments:  Impaired flexibility, Postural dysfunction, Decreased range of motion, Difficulty walking, Decreased mobility, Decreased strength, Increased fascial restricitons, Increased muscle spasms, Pain  Visit Diagnosis: Chronic bilateral low back pain with bilateral sciatica  Abnormal posture     Problem List There are no active problems to display for this patient.   Vincente Poli, PT 02/05/2017, 12:35 PM  [facility] [address] [place], [place], [address] Phone: [phone]   Fax:  [phone]  Name: Terrence Newton MRN: [id number] Date of Birth: 09/16/1960

## 2017-02-11 ENCOUNTER — Ambulatory Visit: Payer: BLUE CROSS/BLUE SHIELD | Admitting: Physical Medicine & Rehabilitation

## 2017-02-11 ENCOUNTER — Encounter: Payer: BLUE CROSS/BLUE SHIELD | Admitting: Physical Therapy

## 2017-02-13 ENCOUNTER — Ambulatory Visit: Payer: BLUE CROSS/BLUE SHIELD | Admitting: Physical Therapy

## 2017-02-13 DIAGNOSIS — R293 Abnormal posture: Secondary | ICD-10-CM

## 2017-02-13 DIAGNOSIS — M5441 Lumbago with sciatica, right side: Principal | ICD-10-CM

## 2017-02-13 DIAGNOSIS — M5442 Lumbago with sciatica, left side: Principal | ICD-10-CM

## 2017-02-13 DIAGNOSIS — G8929 Other chronic pain: Secondary | ICD-10-CM

## 2017-02-13 NOTE — Patient Instructions (Signed)
   HIP FLEXOR STRETCH 2  While lying on a table or high bed, let the affected leg lower towards the floor until a stretch is felt along the front of your thigh.   At the same time, grasp your opposite knee and pull it towards your chest.

## 2017-02-13 NOTE — Therapy (Signed)
[facility] [address], [address] [name], [place], [address] Phone: [phone]   Fax:  [phone]  Physical Therapy Treatment  Patient Details  Name: Terrence Newton MRN: [id number] Date of Birth: 1960-10-12 Referring Provider: Dr. Salli Real  Encounter Date: 02/13/2017      PT End of Session - 02/13/17 1235    Visit Number 6   Number of Visits 12   Date for PT Re-Evaluation 02/25/17   Authorization Type [employer] $25 copay; 60 visit limit   PT Start Time 1147   PT Stop Time 1230   PT Time Calculation (min) 43 min   Activity Tolerance Patient limited by pain      No past medical history on file.  No past surgical history on file.  There were no vitals filed for this visit.      Subjective Assessment - 02/13/17 1150    Subjective I am moving a little better.  Still feeling pain in low back and into the leg.  My knee is bothering me from the gout.  I am having to move a lot when standing at the sink or doing anything where I have to stand and lean a little.   Patient Stated Goals improve pain and functional, wants to be able to work for a couple hours   Currently in Pain? Yes   Pain Score 4   6 with certain movements   Pain Location Back   Pain Orientation Left;Right;Lower   Pain Descriptors / Indicators Nagging   Pain Type Chronic pain   Pain Radiating Towards down to left leg, second and third toe, pain under right big toe   Pain Onset More than a month ago   Pain Frequency Intermittent   Aggravating Factors  standing in one place, driving, walking    Multiple Pain Sites No                         [facility] Adult PT Treatment/Exercise - 02/13/17 0001      Lumbar Exercises: Stretches   Active Hamstring Stretch 3 reps;30 seconds  Adductor stretch; both with strap in supine   Standing Extension 60 seconds;2 reps  hip flexor stretch in supine     Lumbar Exercises: Supine   Other Supine Lumbar  Exercises MELT techniques lumbar rocking and circles both ways     Manual Therapy   Soft tissue mobilization left hip flexor and left adductors                PT Education - 02/13/17 1234    Education provided Yes   Education Details hip flexor stretch supine   Person(s) Educated Patient   Methods Explanation;Demonstration;Handout;Verbal cues   Comprehension Verbalized understanding;Returned demonstration             PT Long Term Goals - 02/13/17 1406      PT LONG TERM GOAL #1   Title independent with HEP   Time 6   Period Weeks     PT LONG TERM GOAL #2   Title verbalize understanding of posture/body mechanics to decrease risk of reinjury   Time 6   Period Weeks   Status On-going     PT LONG TERM GOAL #3   Title demonstrate ability to walk > 500' without increase in pain for improved function   Time 6   Period Weeks   Status On-going     PT LONG TERM GOAL #4  Title report pain < 4/10 with activity for improved function   Time 6   Period Weeks   Status On-going     PT LONG TERM GOAL #5   Title perform lumbar flexion without increase in pain for improved mobility and in order to perform job responsibilities   Time 6   Period Weeks   Status On-going               Plan - 02/13/17 1250    Clinical Impression Statement Patient has made some improvements since previous treatment and is moving better. He was able to tolerate exercises and stretches on foam roller.  Hip flexor stretch helped reduce symptoms in low back.  Pt will benefit from skilled PT to continue working on core strength for improved function.   Rehab Potential Fair   PT Treatment/Interventions ADLs/Self Care Home Management;Cryotherapy;Electrical Stimulation;Moist Heat;Traction;Ultrasound;Balance training;Therapeutic exercise;Therapeutic activities;Functional mobility training;Gait training;Stair training;Patient/family education;Manual techniques;Taping;Dry needling   PT Next Visit  Plan Core strengthening, DN to psoas and adductors, review hip flexor stretch   Consulted and Agree with Plan of Care Patient      Patient will benefit from skilled therapeutic intervention in order to improve the following deficits and impairments:  Impaired flexibility, Postural dysfunction, Decreased range of motion, Difficulty walking, Decreased mobility, Decreased strength, Increased fascial restricitons, Increased muscle spasms, Pain  Visit Diagnosis: Chronic bilateral low back pain with bilateral sciatica  Abnormal posture     Problem List There are no active problems to display for this patient.   Vincente Poli, PT 02/13/2017, 2:10 PM  [facility] [address] [place], [place], [address] Phone: [phone]   Fax:  [phone]  Name: Terrence Newton MRN: [id number] Date of Birth: Sep 21, 1960

## 2017-02-20 ENCOUNTER — Ambulatory Visit: Payer: BLUE CROSS/BLUE SHIELD | Attending: Internal Medicine | Admitting: Physical Therapy

## 2017-02-20 ENCOUNTER — Encounter: Payer: Self-pay | Admitting: Physical Therapy

## 2017-02-20 DIAGNOSIS — G8929 Other chronic pain: Secondary | ICD-10-CM | POA: Diagnosis present

## 2017-02-20 DIAGNOSIS — M5441 Lumbago with sciatica, right side: Secondary | ICD-10-CM | POA: Diagnosis present

## 2017-02-20 DIAGNOSIS — M5442 Lumbago with sciatica, left side: Secondary | ICD-10-CM | POA: Diagnosis present

## 2017-02-20 DIAGNOSIS — R293 Abnormal posture: Secondary | ICD-10-CM | POA: Insufficient documentation

## 2017-02-20 NOTE — Patient Instructions (Signed)

## 2017-02-20 NOTE — Therapy (Signed)
[facility] [address], [address] [place], [place], [address] Phone: [phone]   Fax:  [phone]  Physical Therapy Treatment  Patient Details  Name: Terrence Newton MRN: [id number] Date of Birth: November 29, 1960 Referring Provider: Dr. Salli Real  Encounter Date: 02/20/2017      PT End of Session - 02/20/17 1107    Visit Number 7   Number of Visits 12   Date for PT Re-Evaluation 04/03/17   Authorization Type BCBS $25 copay; 60 visit limit   PT Start Time 1101   PT Stop Time 1144   PT Time Calculation (min) 43 min   Activity Tolerance Patient limited by pain      History reviewed. No pertinent past medical history.  History reviewed. No pertinent surgical history.  There were no vitals filed for this visit.      Subjective Assessment - 02/20/17 1107    Subjective I got down to look in some cabinets after last session for about one minute and when I stood up something shifted and I was in a lot of pain.  I started feeling better Tuesday and now I feel about the same.  I feel about 30% better than when starting PT.   Currently in Pain? Yes   Pain Score 7    Pain Location Back   Pain Orientation Left;Right;Lower   Pain Descriptors / Indicators Nagging   Pain Type Chronic pain   Pain Radiating Towards down left leg to the toes   Pain Onset More than a month ago                         [facility] Adult PT Treatment/Exercise - 02/20/17 0001      Lumbar Exercises: Aerobic   Stationary Bike L1 x 8 min     Lumbar Exercises: Supine   Other Supine Lumbar Exercises hip adduction with core activation - 10x 10 sec holds for adductor stretch     Manual Therapy   Soft tissue mobilization left hip adductors and glutes          Trigger Point Dry Needling - 02/20/17 1220    Consent Given? Yes   Education Handout Provided Yes   Muscles Treated Lower Body Adductor longus/brevius/maximus   Adductor Response Twitch  response elicited;Palpable increased muscle length              PT Education - 02/20/17 1140    Education provided Yes   Education Details dry needling aftercare   Person(s) Educated Patient   Methods Explanation;Handout   Comprehension Verbalized understanding             PT Long Term Goals - 02/20/17 1118      PT LONG TERM GOAL #1   Title independent with HEP   Time 6   Period Weeks   Status On-going     PT LONG TERM GOAL #2   Title verbalize understanding of posture/body mechanics to decrease risk of reinjury   Time 6   Period Weeks   Status On-going     PT LONG TERM GOAL #3   Title demonstrate ability to walk > 500' without increase in pain for improved function   Baseline walking about 20 feet      PT LONG TERM GOAL #4   Title report pain < 4/10 with activity for improved function   Baseline 7/10   Time 6   Period Weeks   Status On-going  PT LONG TERM GOAL #5   Title perform lumbar flexion without increase in pain for improved mobility and in order to perform job responsibilities   Time 6   Period Weeks   Status On-going               Plan - 02/20/17 1217    Clinical Impression Statement Patient has improved 30% since starting PT.  He has made very slow progress but only coming 1x per week.  He is responding well to stretching and ROM  as well as manual therapies . Strength is progressing slowly due to high irritability and having other issues such as gout in his knee.  Pt will benefit from continued therapy at this time andis recommended 2x/week to see if progress will speed up with more focus on therapy.     Rehab Potential Good   PT Frequency 2x / week   PT Duration 6 weeks   PT Treatment/Interventions ADLs/Self Care Home Management;Cryotherapy;Electrical Stimulation;Moist Heat;Traction;Ultrasound;Balance training;Therapeutic exercise;Therapeutic activities;Functional mobility training;Gait training;Stair training;Patient/family  education;Manual techniques;Taping;Dry needling   PT Next Visit Plan progress core strengthening as tolerated, review hip flexor stretch, f/u on response to dry needling, manual as needed, hip and lumbar ROM   Consulted and Agree with Plan of Care Patient      Patient will benefit from skilled therapeutic intervention in order to improve the following deficits and impairments:  Impaired flexibility, Postural dysfunction, Decreased range of motion, Difficulty walking, Decreased mobility, Decreased strength, Increased fascial restricitons, Increased muscle spasms, Pain  Visit Diagnosis: Chronic bilateral low back pain with bilateral sciatica - Plan: PT plan of care cert/re-cert  Abnormal posture - Plan: PT plan of care cert/re-cert     Problem List There are no active problems to display for this patient.   Vincente Poli, PT 02/20/2017, 12:23 PM  [facility] [address] [place], [place], [address] Phone: [phone]   Fax:  [phone]  Name: Maysen Sudol MRN: [id number] Date of Birth: 04-24-1961

## 2017-02-25 ENCOUNTER — Ambulatory Visit: Payer: BLUE CROSS/BLUE SHIELD | Admitting: Physical Therapy

## 2017-02-25 DIAGNOSIS — M5441 Lumbago with sciatica, right side: Principal | ICD-10-CM

## 2017-02-25 DIAGNOSIS — R293 Abnormal posture: Secondary | ICD-10-CM

## 2017-02-25 DIAGNOSIS — M5442 Lumbago with sciatica, left side: Principal | ICD-10-CM

## 2017-02-25 DIAGNOSIS — G8929 Other chronic pain: Secondary | ICD-10-CM

## 2017-02-25 NOTE — Therapy (Signed)
[facility] [address] [place], [place], [address] Phone: [phone]   Fax:  [phone]  Physical Therapy Treatment  Patient Details  Name: Terrence Newton MRN: [id number] Date of Birth: [birth year] Referring Provider: Dr. Salli Real  Encounter Date: 02/25/2017      PT End of Session - 02/25/17 1149    Visit Number 8   Number of Visits 12   Date for PT Re-Evaluation 04/03/17   Authorization Type BCBS $25 copay; 60 visit limit   PT Start Time 1148   PT Stop Time 1230   PT Time Calculation (min) 42 min   Activity Tolerance Patient limited by pain      No past medical history on file.  No past surgical history on file.  There were no vitals filed for this visit.      Subjective Assessment - 02/25/17 1150    Subjective I was achy all over yesterday down into the legs and my shoulder has been hurting and numb too.     Pertinent History laminectomy (laser and orthopedic) L5-S1 x 2 (2004, 2007)   Limitations Lifting;House hold activities;Walking;Standing;Sitting   How long can you sit comfortably? unable (pt restless but sitting throughout subjective of eval)   How long can you stand comfortably? "very little"   How long can you walk comfortably? "couple hundred feet"   Diagnostic tests CT scan: multilevel spondylosis with facet arthropathy; mild central canal and foraminal narrowing L4-5; foraminal narrowing at L5-S1   Patient Stated Goals improve pain and functional, wants to be able to work for a couple hours   Currently in Pain? Yes   Pain Score 6    Pain Location Back   Pain Orientation Right;Left;Lower   Pain Descriptors / Indicators Nagging   Pain Onset More than a month ago   Pain Frequency Intermittent   Aggravating Factors  moving the wrong way   Pain Relieving Factors engaging the core more   Effect of Pain on Daily Activities unable to work   Multiple Pain Sites No                          [facility] Adult PT Treatment/Exercise - 02/25/17 0001      Lumbar Exercises: Stretches   Active Hamstring Stretch 3 reps;30 seconds  Adductor stretch; both with strap in supine   Standing Extension 3 reps;30 seconds  hip flexor stretch in supine   Piriformis Stretch 3 reps;30 seconds  knee across and figure 4 in supine     Lumbar Exercises: Aerobic   Stationary Bike L1 x 3.5 min  began having increased pain     Lumbar Exercises: Supine   Bent Knee Raise 10 reps  bracing core each time, UE with 3lb 10x   Bridge 5 reps  became sore down the leg   Straight Leg Raise --   Other Supine Lumbar Exercises hip adduction with core activation - 10x 10 sec holds for adductor stretch   Other Supine Lumbar Exercises leg lengthening                     PT Long Term Goals - 02/25/17 1151      PT LONG TERM GOAL #1   Title independent with HEP   Time 6   Period Weeks   Status On-going     PT LONG TERM GOAL #2   Title verbalize understanding of posture/body mechanics to decrease  risk of reinjury   Time 6   Period Weeks   Status On-going     PT LONG TERM GOAL #3   Title demonstrate ability to walk > 500' without increase in pain for improved function   Baseline walking about 20 feet    Time 6   Period Weeks   Status On-going     PT LONG TERM GOAL #4   Title report pain < 4/10 with activity for improved function   Baseline 7/10   Time 6   Period Weeks   Status On-going     PT LONG TERM GOAL #5   Title perform lumbar flexion without increase in pain for improved mobility and in order to perform job responsibilities   Time 6   Period Weeks   Status On-going               Plan - 02/25/17 1221    Clinical Impression Statement Patient was more irritated with exercises today.  He tolerated the exercises better with heat under low back.  Patinet had pain with hip flexion in hook lying but able to lift leg from side of the mat  without pain.  became irritated with bridges and clams as well.  Pt cotinutes to need skilled PT to work on core strength for improved functional movments   Rehab Potential Good   PT Treatment/Interventions ADLs/Self Care Home Management;Cryotherapy;Electrical Stimulation;Moist Heat;Traction;Ultrasound;Balance training;Therapeutic exercise;Therapeutic activities;Functional mobility training;Gait training;Stair training;Patient/family education;Manual techniques;Taping;Dry needling   PT Next Visit Plan progress core strengthening as tolerated, review hip flexor stretch, manual as needed, hip and lumbar ROM   Consulted and Agree with Plan of Care Patient      Patient will benefit from skilled therapeutic intervention in order to improve the following deficits and impairments:  Impaired flexibility, Postural dysfunction, Decreased range of motion, Difficulty walking, Decreased mobility, Decreased strength, Increased fascial restricitons, Increased muscle spasms, Pain  Visit Diagnosis: Chronic bilateral low back pain with bilateral sciatica  Abnormal posture     Problem List There are no active problems to display for this patient.   Vincente Poli, PT 02/25/2017, 1:29 PM  [facility] [address], [address] [place], [place], [address] Phone: [phone]   Fax:  [phone]  Name: Carmine Carrozza MRN: [id number] Date of Birth: November 27, 1960

## 2017-02-28 ENCOUNTER — Encounter: Payer: BLUE CROSS/BLUE SHIELD | Admitting: Physical Therapy

## 2017-03-04 ENCOUNTER — Encounter: Payer: Self-pay | Admitting: Physical Therapy

## 2017-03-04 ENCOUNTER — Ambulatory Visit: Payer: BLUE CROSS/BLUE SHIELD | Admitting: Physical Therapy

## 2017-03-04 DIAGNOSIS — R293 Abnormal posture: Secondary | ICD-10-CM

## 2017-03-04 DIAGNOSIS — M5441 Lumbago with sciatica, right side: Principal | ICD-10-CM

## 2017-03-04 DIAGNOSIS — G8929 Other chronic pain: Secondary | ICD-10-CM

## 2017-03-04 DIAGNOSIS — M5442 Lumbago with sciatica, left side: Principal | ICD-10-CM

## 2017-03-04 NOTE — Therapy (Signed)
[facility] [address] [place], [place], [address] Phone: [phone]   Fax:  [phone]  Physical Therapy Treatment  Patient Details  Name: Terrence Newton MRN: [id number] Date of Birth: [birth year] Referring Provider: Dr. Salli Real  Encounter Date: 03/04/2017      PT End of Session - 03/04/17 1101    Visit Number 9   Number of Visits 12   Date for PT Re-Evaluation 04/03/17   Authorization Type BCBS $25 copay; 60 visit limit   PT Start Time 1100   PT Stop Time 1140   PT Time Calculation (min) 40 min   Activity Tolerance Patient limited by pain   Behavior During Therapy Restless      History reviewed. No pertinent past medical history.  History reviewed. No pertinent surgical history.  There were no vitals filed for this visit.      Subjective Assessment - 03/04/17 1102    Subjective I did some light yard work this weekend and ibuprophen helped a lot.  Pt states he is doing "pretty good"   Pertinent History laminectomy (laser and orthopedic) L5-S1 x 2 (2004, 2007)   Limitations Lifting;House hold activities;Walking;Standing;Sitting   How long can you sit comfortably? unable (pt restless but sitting throughout subjective of eval)   Diagnostic tests CT scan: multilevel spondylosis with facet arthropathy; mild central canal and foraminal narrowing L4-5; foraminal narrowing at L5-S1   Patient Stated Goals improve pain and functional, wants to be able to work for a couple hours   Currently in Pain? Yes   Pain Score 4    Pain Location Hip   Pain Orientation Left   Pain Descriptors / Indicators Nagging   Pain Radiating Towards left toes numb   Pain Onset More than a month ago   Aggravating Factors  moving the wrong way   Pain Relieving Factors ibuprophen   Effect of Pain on Daily Activities house work   Multiple Pain Sites No                         [facility] Adult PT Treatment/Exercise -  03/04/17 0001      Lumbar Exercises: Stretches   Active Hamstring Stretch 3 reps;30 seconds  Adductor stretch; both with strap in supine   Standing Extension 3 reps;30 seconds  hip flexor stretch in supine   Piriformis Stretch 3 reps;30 seconds  knee across and figure 4 in supine     Lumbar Exercises: Aerobic   Stationary Bike L1 x 6 min     Lumbar Exercises: Standing   Other Standing Lumbar Exercises hip flexion     Lumbar Exercises: Seated   Hip Flexion on Ball Strengthening;Both;10 reps  circles and UE flexion seated on ball - 15x each     Lumbar Exercises: Supine   Clam 20 reps  yellow band   Bent Knee Raise 10 reps  bracing core each time, UE with 3lb 10x                     PT Long Term Goals - 03/04/17 1115      PT LONG TERM GOAL #1   Title independent with HEP   Time 6   Period Weeks   Status On-going     PT LONG TERM GOAL #2   Title verbalize understanding of posture/body mechanics to decrease risk of reinjury   Time 6   Period Weeks   Status On-going  PT LONG TERM GOAL #3   Title demonstrate ability to walk > 500' without increase in pain for improved function   Baseline walking about 20 feet    Time 6   Period Weeks   Status On-going     PT LONG TERM GOAL #4   Title report pain < 4/10 with activity for improved function   Time 6   Period Weeks   Status On-going     PT LONG TERM GOAL #5   Title perform lumbar flexion without increase in pain for improved mobility and in order to perform job responsibilities   Time 6   Period Weeks   Status On-going               Plan - 03/04/17 1105    Clinical Impression Statement Patient did well with exercises today.  He was able to tolerate more difficult exercises including seating on ball incorporating some lumbar mobility with strengthening.  Pt will benefit from skilled PT to progress core strength for functional movements.     Rehab Potential Good   PT Treatment/Interventions  ADLs/Self Care Home Management;Cryotherapy;Electrical Stimulation;Moist Heat;Traction;Ultrasound;Balance training;Therapeutic exercise;Therapeutic activities;Functional mobility training;Gait training;Stair training;Patient/family education;Manual techniques;Taping;Dry needling   PT Next Visit Plan progress core strengthening as tolerated, review hip flexor stretch, manual as needed, hip and lumbar ROM      Patient will benefit from skilled therapeutic intervention in order to improve the following deficits and impairments:  Impaired flexibility, Postural dysfunction, Decreased range of motion, Difficulty walking, Decreased mobility, Decreased strength, Increased fascial restricitons, Increased muscle spasms, Pain  Visit Diagnosis: Chronic bilateral low back pain with bilateral sciatica  Abnormal posture     Problem List There are no active problems to display for this patient.   Vincente Poli, PT 03/04/2017, 11:39 AM  [facility] [address] [place], [place], [address] Phone: [phone]   Fax:  [phone]  Name: Terrence Newton MRN: [id number] Date of Birth: 1961-05-20

## 2017-03-06 ENCOUNTER — Encounter: Payer: BLUE CROSS/BLUE SHIELD | Admitting: Physical Therapy

## 2017-03-11 ENCOUNTER — Encounter: Payer: BLUE CROSS/BLUE SHIELD | Admitting: Physical Therapy

## 2017-03-13 ENCOUNTER — Encounter: Payer: Self-pay | Admitting: Physical Therapy

## 2017-03-13 ENCOUNTER — Ambulatory Visit: Payer: BLUE CROSS/BLUE SHIELD | Admitting: Physical Therapy

## 2017-03-13 DIAGNOSIS — G8929 Other chronic pain: Secondary | ICD-10-CM

## 2017-03-13 DIAGNOSIS — M5442 Lumbago with sciatica, left side: Secondary | ICD-10-CM | POA: Diagnosis not present

## 2017-03-13 DIAGNOSIS — R293 Abnormal posture: Secondary | ICD-10-CM

## 2017-03-13 DIAGNOSIS — M5441 Lumbago with sciatica, right side: Principal | ICD-10-CM

## 2017-03-13 NOTE — Therapy (Signed)
[facility] [address] [place], [place], [address] Phone: [phone]   Fax:  [phone]  Physical Therapy Treatment  Patient Details  Name: Terrence Newton MRN: [id number] Date of Birth: [birth year] Referring Provider: Dr. Salli Real  Encounter Date: 03/13/2017      PT End of Session - 03/13/17 1241    Visit Number 10   Number of Visits 12   Date for PT Re-Evaluation 04/03/17   Authorization Type [employer] $25 copay; 60 visit limit   PT Start Time 1235   PT Stop Time 1316   PT Time Calculation (min) 41 min   Activity Tolerance Patient limited by pain      History reviewed. No pertinent past medical history.  History reviewed. No pertinent surgical history.  There were no vitals filed for this visit.      Subjective Assessment - 03/13/17 1238    Subjective I did a lot around the house and went fishing this weekend and seemed to aggravate thing and Monday was a bad day.    Pertinent History laminectomy (laser and orthopedic) L5-S1 x 2 (2004, 2007)   Limitations Lifting;House hold activities;Walking;Standing;Sitting   How long can you sit comfortably? unable (pt restless but sitting throughout subjective of eval)   How long can you stand comfortably? "very little"   How long can you walk comfortably? "couple hundred feet"   Diagnostic tests CT scan: multilevel spondylosis with facet arthropathy; mild central canal and foraminal narrowing L4-5; foraminal narrowing at L5-S1   Patient Stated Goals improve pain and functional, wants to be able to work for a couple hours   Currently in Pain? Yes   Pain Score 4    Pain Location Hip   Pain Orientation Left   Pain Descriptors / Indicators Nagging   Pain Type Chronic pain   Pain Onset More than a month ago   Pain Frequency Intermittent                         [facility] Adult PT Treatment/Exercise - 03/13/17 0001      Neuro Re-ed    Neuro Re-ed Details   keeping abdominals engaged throughout exercises     Lumbar Exercises: Stretches   Single Knee to Chest Stretch --  10 reps bilat on foam roller - rocking; circles   Double Knee to Chest Stretch 5 reps   Piriformis Stretch 3 reps;30 seconds  knee across and figure 4 in supine     Lumbar Exercises: Aerobic   Stationary Bike L1 x 6 min     Lumbar Exercises: Standing   Other Standing Lumbar Exercises lat stretch holding on to staircase - 3 x 20 sec   Other Standing Lumbar Exercises step up on 4 " step 10x each side     Lumbar Exercises: Seated   Hip Flexion on Ball Strengthening;Both;10 reps  circles and UE flexion seated on ball - 15x each     Lumbar Exercises: Supine   Bridge --  6 reps then felt in right sciatic   Straight Leg Raise 5 reps   Other Supine Lumbar Exercises lying on foam roll under sacrum - sktc, dktc, circles and rotation - 10x each   Other Supine Lumbar Exercises heel slide 15x each side core contraction                     PT Long Term Goals - 03/13/17 1243  PT LONG TERM GOAL #1   Title independent with HEP   Period Weeks   Status On-going     PT LONG TERM GOAL #2   Title verbalize understanding of posture/body mechanics to decrease risk of reinjury   Time 6   Period Weeks   Status On-going     PT LONG TERM GOAL #3   Title demonstrate ability to walk > 500' without increase in pain for improved function   Baseline I am able to walk more depending on the day, can't walk down to the mailbox and back uphill is hard; 4700ft on level surface   Time 6   Period Weeks   Status On-going     PT LONG TERM GOAL #4   Title report pain < 4/10 with activity for improved function   Time 6   Period Weeks   Status On-going     PT LONG TERM GOAL #5   Title perform lumbar flexion without increase in pain for improved mobility and in order to perform job responsibilities   Time 6   Period Weeks   Status On-going               Plan -  03/13/17 1316    Clinical Impression Statement Patient was able to increase difficulty of exercises.  He demonstrates improvement towards goals and able to walk 400 feet before pain increases.  He continues to have difficulty with bridges and is using low back to lift causing increased pain.  Pt had improved symptoms after using foam roll for lumbar release of fascia.  He continues to need skilled PT in order to work on improved abdominal recruitment during functional activities and improved lumbar mobilty.     PT Treatment/Interventions ADLs/Self Care Home Management;Cryotherapy;Electrical Stimulation;Moist Heat;Traction;Ultrasound;Balance training;Therapeutic exercise;Therapeutic activities;Functional mobility training;Gait training;Stair training;Patient/family education;Manual techniques;Taping;Dry needling   PT Next Visit Plan progress core strengthening as tolerated,, manual as needed, hip and lumbar ROM   Consulted and Agree with Plan of Care Patient      Patient will benefit from skilled therapeutic intervention in order to improve the following deficits and impairments:  Impaired flexibility, Postural dysfunction, Decreased range of motion, Difficulty walking, Decreased mobility, Decreased strength, Increased fascial restricitons, Increased muscle spasms, Pain  Visit Diagnosis: Chronic bilateral low back pain with bilateral sciatica  Abnormal posture     Problem List There are no active problems to display for this patient.   Vincente PoliJakki Crosser, PT 03/13/2017, 1:26 PM  [facility] [address] [place], [place], [address] Phone: [phone]   Fax:  [phone]  Name: Terrence Newton MRN: [id number] Date of Birth: Apr 01, 1961

## 2017-03-18 ENCOUNTER — Ambulatory Visit: Payer: BLUE CROSS/BLUE SHIELD | Admitting: Physical Therapy

## 2017-03-18 DIAGNOSIS — M5442 Lumbago with sciatica, left side: Secondary | ICD-10-CM | POA: Diagnosis not present

## 2017-03-18 DIAGNOSIS — G8929 Other chronic pain: Secondary | ICD-10-CM

## 2017-03-18 DIAGNOSIS — R293 Abnormal posture: Secondary | ICD-10-CM

## 2017-03-18 DIAGNOSIS — M5441 Lumbago with sciatica, right side: Principal | ICD-10-CM

## 2017-03-18 NOTE — Therapy (Signed)
[facility] [address], [address] [place], [place], [address] Phone: [phone]   Fax:  [phone]  Physical Therapy Treatment  Patient Details  Name: Ehan Freas MRN: [id number] Date of Birth: 1960-09-10 Referring Provider: Dr. Salli Real  Encounter Date: 03/18/2017      PT End of Session - 03/18/17 1100    Visit Number 11   Date for PT Re-Evaluation 04/03/17   Authorization Type [employer] $25 copay; 60 visit limit   PT Start Time 1101   PT Stop Time 1142   PT Time Calculation (min) 41 min   Activity Tolerance Patient limited by pain      No past medical history on file.  No past surgical history on file.  There were no vitals filed for this visit.      Subjective Assessment - 03/18/17 1102    Subjective I was running around this morning looking all over for my shoes.  Pt states it is twinging a little more today.  I didn't feel bad this weekend.     Pertinent History laminectomy (laser and orthopedic) L5-S1 x 2 (2004, 2007)   Limitations Lifting;House hold activities;Walking;Standing;Sitting   Patient Stated Goals improve pain and functional, wants to be able to work for a couple hours   Currently in Pain? Yes   Pain Score 7    Pain Location Back   Pain Orientation Lower;Left   Pain Descriptors / Indicators Nagging;Shooting   Pain Type Chronic pain   Pain Radiating Towards down into thigh and knee   Pain Onset More than a month ago   Pain Frequency Intermittent   Multiple Pain Sites No                         [facility] Adult PT Treatment/Exercise - 03/18/17 0001      Neuro Re-ed    Neuro Re-ed Details  keeping abdominals engaged throughout exercises     Lumbar Exercises: Stretches   Active Hamstring Stretch 3 reps;30 seconds  Adductor stretch; both with strap in supine   Lower Trunk Rotation 5 reps;10 seconds   Lower Trunk Rotation Limitations open book stretch 5 x each side   Piriformis  Stretch 3 reps;30 seconds  knee across and figure 4 in supine     Lumbar Exercises: Aerobic   Stationary Bike L1 x 10 min     Lumbar Exercises: Supine   Other Supine Lumbar Exercises lying on foam roll under sacrum - sktc, dktc, circles and rotation - 10x each   Other Supine Lumbar Exercises heel slide, leg lengthening each side 15x each side core contraction     Moist Heat Therapy   Number Minutes Moist Heat 20 Minutes  during therex   Moist Heat Location Lumbar Spine                     PT Long Term Goals - 03/13/17 1243      PT LONG TERM GOAL #1   Title independent with HEP   Period Weeks   Status On-going     PT LONG TERM GOAL #2   Title verbalize understanding of posture/body mechanics to decrease risk of reinjury   Time 6   Period Weeks   Status On-going     PT LONG TERM GOAL #3   Title demonstrate ability to walk > 500' without increase in pain for improved function   Baseline I am able to walk more  depending on the day, can't walk down to the mailbox and back uphill is hard; 4400ft on level surface   Time 6   Period Weeks   Status On-going     PT LONG TERM GOAL #4   Title report pain < 4/10 with activity for improved function   Time 6   Period Weeks   Status On-going     PT LONG TERM GOAL #5   Title perform lumbar flexion without increase in pain for improved mobility and in order to perform job responsibilities   Time 6   Period Weeks   Status On-going               Plan - 03/18/17 1108    Clinical Impression Statement Patient was educated on mindfulness and breathing with activities especially when he was getting stressed throughout the day.  He reported feeling better after cues to calm nervous system with relaxation techniques.  Pt is still having neural pain but it seems to be exacerbated with more heightened state of stress.  Pt was not able to tolerate as much today due to increased pain, but did well with keeping core engaged  during the exercises during today's session.   Rehab Potential Good   PT Treatment/Interventions ADLs/Self Care Home Management;Cryotherapy;Electrical Stimulation;Moist Heat;Traction;Ultrasound;Balance training;Therapeutic exercise;Therapeutic activities;Functional mobility training;Gait training;Stair training;Patient/family education;Manual techniques;Taping;Dry needling   PT Next Visit Plan progress core strengthening as tolerated, manual as needed, hip and lumbar ROM   Consulted and Agree with Plan of Care Patient      Patient will benefit from skilled therapeutic intervention in order to improve the following deficits and impairments:  Impaired flexibility, Postural dysfunction, Decreased range of motion, Difficulty walking, Decreased mobility, Decreased strength, Increased fascial restricitons, Increased muscle spasms, Pain  Visit Diagnosis: Chronic bilateral low back pain with bilateral sciatica  Abnormal posture     Problem List There are no active problems to display for this patient.   Vincente PoliJakki Crosser, PT 03/18/2017, 12:28 PM  [facility] [address], [address] [place], [place], [address] Phone: [phone]   Fax:  [phone]  Name: Dianah FieldJacques Norberto MRN: [id number] Date of Birth: Jan 24, 1961

## 2017-03-20 ENCOUNTER — Encounter: Payer: Self-pay | Admitting: Physical Therapy

## 2017-03-20 ENCOUNTER — Ambulatory Visit: Payer: BLUE CROSS/BLUE SHIELD | Attending: Internal Medicine | Admitting: Physical Therapy

## 2017-03-20 DIAGNOSIS — R293 Abnormal posture: Secondary | ICD-10-CM

## 2017-03-20 DIAGNOSIS — M5442 Lumbago with sciatica, left side: Secondary | ICD-10-CM | POA: Diagnosis not present

## 2017-03-20 DIAGNOSIS — G8929 Other chronic pain: Secondary | ICD-10-CM

## 2017-03-20 DIAGNOSIS — M5441 Lumbago with sciatica, right side: Secondary | ICD-10-CM | POA: Diagnosis present

## 2017-03-20 NOTE — Therapy (Addendum)
[facility] [address], [place], [place], [address] Phone: [phone]   Fax:  [phone]  Physical Therapy Treatment  Patient Details  Name: Terrence Newton MRN: [id number] Date of Birth: January 24, 1961 Referring Provider: Dr. Sandi Mariscal  Encounter Date: 03/20/2017      PT End of Session - 03/20/17 1107    Visit Number 12   Date for PT Re-Evaluation 04/03/17   Authorization Type [employer] $25 copay; 60 visit limit   PT Start Time 1102   PT Stop Time 1150   PT Time Calculation (min) 48 min   Activity Tolerance Patient limited by pain      History reviewed. No pertinent past medical history.  History reviewed. No pertinent surgical history.  There were no vitals filed for this visit.      Subjective Assessment - 03/20/17 1108    Subjective I had a flare up after Tuesday after leaving here.  Pt states he was okay this morning but once he got outside he felt more pain and the ride over here irritated his back and started feeling it in the right thigh as well as the left.   Pertinent History laminectomy (laser and orthopedic) L5-S1 x 2 (2004, 2007)   Limitations Lifting;House hold activities;Walking;Standing;Sitting   Patient Stated Goals improve pain and functional, wants to be able to work for a couple hours   Currently in Pain? Yes   Pain Score 7    Pain Location Back   Pain Orientation Lower;Right;Left   Pain Descriptors / Indicators Shooting   Pain Type Chronic pain   Pain Onset More than a month ago   Pain Frequency Intermittent   Aggravating Factors  moving the wrong way, doing too much   Pain Relieving Factors pain medicine   Effect of Pain on Daily Activities house   Multiple Pain Sites No                         [facility] Adult PT Treatment/Exercise - 03/20/17 0001      Lumbar Exercises: Stretches   Active Hamstring Stretch 3 reps;30 seconds  Adductor stretch; both with strap in supine   Active Hamstring Stretch Limitations Hip flexor stretch - 3x30s   Lower Trunk Rotation 5 reps  one leg at a time Hip rotatoin ER/IR   Lower Trunk Rotation Limitations open book stretch 5 x each side     Lumbar Exercises: Aerobic   Stationary Bike L1 x 10 min     Modalities   Modalities Traction     Traction   Type of Traction Lumbar   Min (lbs) 10   Max (lbs) 105  body weight 214 lb   Hold Time 60   Rest Time 10   Time 15     Manual Therapy   Joint Mobilization --                     PT Long Term Goals - 03/13/17 1243      PT LONG TERM GOAL #1   Title independent with HEP   Period Weeks   Status On-going     PT LONG TERM GOAL #2   Title verbalize understanding of posture/body mechanics to decrease risk of reinjury   Time 6   Period Weeks   Status On-going     PT LONG TERM GOAL #3   Title demonstrate ability to walk > 500' without increase in pain for  improved function   Baseline I am able to walk more depending on the day, can't walk down to the mailbox and back uphill is hard; 415f on level surface   Time 6   Period Weeks   Status On-going     PT LONG TERM GOAL #4   Title report pain < 4/10 with activity for improved function   Time 6   Period Weeks   Status On-going     PT LONG TERM GOAL #5   Title perform lumbar flexion without increase in pain for improved mobility and in order to perform job responsibilities   Time 6   Period Weeks   Status On-going               Plan - 03/20/17 1131    Clinical Impression Statement More emphasis on stretches today due to being flared up today.  Pt has made some improvements with being able to do more exercises at home but has been continuing to have consistent flare ups 1-2x/week.  Mechanical traction provided today to work on decompression of the low back and reduce muscle spasms.  Pt continues to benefit from skilled PT to address impairments and    PT Treatment/Interventions ADLs/Self Care  Home Management;Cryotherapy;Electrical Stimulation;Moist Heat;Traction;Ultrasound;Balance training;Therapeutic exercise;Therapeutic activities;Functional mobility training;Gait training;Stair training;Patient/family education;Manual techniques;Taping;Dry needling   PT Next Visit Plan progress core strengthening as tolerated, manual as needed, hip and lumbar ROM   Consulted and Agree with Plan of Care Patient      Patient will benefit from skilled therapeutic intervention in order to improve the following deficits and impairments:  Impaired flexibility, Postural dysfunction, Decreased range of motion, Difficulty walking, Decreased mobility, Decreased strength, Increased fascial restricitons, Increased muscle spasms, Pain  Visit Diagnosis: Chronic bilateral low back pain with bilateral sciatica  Abnormal posture     Problem List There are no active problems to display for this patient.   JZannie Cove PT 03/20/2017, 11:46 AM  [facility] [address] [place] [address]: [phone]  Fax:  [phone] Name: JHolton SidmanMRN: [id number] of Birth: [birth year] PHYSICAL THERAPY DISCHARGE SUMMARY  Visits from Start of Care: 12  Current functional level related to goals / functional outcomes: See above   Remaining deficits: See above   Education / Equipment: HEP  Plan: Patient agrees to discharge.  Patient goals were not met. Patient is being discharged due to not returning since the last visit.  ?????         JGoogle PT 05/07/17 3:36 PM

## 2017-03-25 ENCOUNTER — Ambulatory Visit: Payer: BLUE CROSS/BLUE SHIELD | Admitting: Physical Therapy

## 2017-03-27 ENCOUNTER — Encounter: Payer: BLUE CROSS/BLUE SHIELD | Admitting: Physical Therapy

## 2017-03-31 ENCOUNTER — Encounter: Payer: BLUE CROSS/BLUE SHIELD | Admitting: Physical Therapy

## 2017-04-02 ENCOUNTER — Encounter: Payer: BLUE CROSS/BLUE SHIELD | Admitting: Physical Therapy

## 2017-07-15 ENCOUNTER — Other Ambulatory Visit: Payer: Self-pay | Admitting: Pain Medicine

## 2017-07-15 DIAGNOSIS — M542 Cervicalgia: Secondary | ICD-10-CM

## 2017-07-23 ENCOUNTER — Ambulatory Visit
Admission: RE | Admit: 2017-07-23 | Discharge: 2017-07-23 | Disposition: A | Discharge status: Home / Self Care | Payer: BLUE CROSS/BLUE SHIELD | Source: Ambulatory Visit | Attending: Pain Medicine | Admitting: Pain Medicine

## 2017-07-23 DIAGNOSIS — M542 Cervicalgia: Secondary | ICD-10-CM

## 2018-05-19 ENCOUNTER — Emergency Department (HOSPITAL_COMMUNITY): Payer: BLUE CROSS/BLUE SHIELD

## 2018-05-19 ENCOUNTER — Emergency Department (HOSPITAL_COMMUNITY)
Admission: EM | Admit: 2018-05-19 | Discharge: 2018-05-19 | Disposition: A | Discharge status: Home / Self Care | Payer: BLUE CROSS/BLUE SHIELD | Attending: Emergency Medicine | Admitting: Emergency Medicine

## 2018-05-19 ENCOUNTER — Encounter (HOSPITAL_COMMUNITY): Payer: Self-pay

## 2018-05-19 DIAGNOSIS — I10 Essential (primary) hypertension: Secondary | ICD-10-CM | POA: Insufficient documentation

## 2018-05-19 DIAGNOSIS — Z79899 Other long term (current) drug therapy: Secondary | ICD-10-CM | POA: Insufficient documentation

## 2018-05-19 DIAGNOSIS — Y929 Unspecified place or not applicable: Secondary | ICD-10-CM | POA: Insufficient documentation

## 2018-05-19 DIAGNOSIS — T17900A Unspecified foreign body in respiratory tract, part unspecified causing asphyxiation, initial encounter: Secondary | ICD-10-CM | POA: Diagnosis not present

## 2018-05-19 DIAGNOSIS — Z87891 Personal history of nicotine dependence: Secondary | ICD-10-CM | POA: Insufficient documentation

## 2018-05-19 DIAGNOSIS — Y9389 Activity, other specified: Secondary | ICD-10-CM | POA: Insufficient documentation

## 2018-05-19 DIAGNOSIS — T17908A Unspecified foreign body in respiratory tract, part unspecified causing other injury, initial encounter: Secondary | ICD-10-CM

## 2018-05-19 DIAGNOSIS — Y999 Unspecified external cause status: Secondary | ICD-10-CM | POA: Insufficient documentation

## 2018-05-19 DIAGNOSIS — R05 Cough: Secondary | ICD-10-CM | POA: Diagnosis present

## 2018-05-19 DIAGNOSIS — X58XXXA Exposure to other specified factors, initial encounter: Secondary | ICD-10-CM | POA: Insufficient documentation

## 2018-05-19 HISTORY — DX: Essential (primary) hypertension: I10

## 2018-05-19 NOTE — Discharge Instructions (Signed)
Follow up with your family doc.  Return for worsening trouble breathing.   Your chest xray was normal.

## 2018-05-19 NOTE — ED Triage Notes (Signed)
Pt presents with c/o cracker that he believes was stuck in his throat from Friday night. Pt reports that his throat has since been sore and he feels like he now has a cold. Pt reports he still feels like there is something stuck in his throat.

## 2018-05-19 NOTE — ED Provider Notes (Signed)
[facility]-EMERGENCY DEPT Provider Note   CSN: [id number] Arrival date & time: 05/19/18  1700     History   Chief Complaint Chief Complaint  Patient presents with  . Swallowed Foreign Body    HPI Terrence Newton is a 57 y.o. male.  57 yo M with a chief complaints of foreign body aspiration.  The patient was eating a cracker and felt like it got stuck in his windpipe.  He tried to cough it out but was unable.  At some point he felt like he was unable to talk.  Eventually he felt that move and after that he was able to talk but had a bit of a coughing fit.  This went on for a few days it has gotten significantly better but he still having some coughing and so came here for evaluation.  He denies fevers or chills.  Denies other possible aspiration.  The history is provided by the patient.  Swallowed Foreign Body  Pertinent negatives include no chest pain, no abdominal pain, no headaches and no shortness of breath.  Illness  This is a new problem. The current episode started 2 days ago. The problem occurs constantly. The problem has not changed since onset.Pertinent negatives include no chest pain, no abdominal pain, no headaches and no shortness of breath. Nothing aggravates the symptoms. Nothing relieves the symptoms. He has tried nothing for the symptoms. The treatment provided no relief.    Past Medical History:  Diagnosis Date  . Hypertension     There are no active problems to display for this patient.   Past Surgical History:  Procedure Laterality Date  . BACK SURGERY    . SHOULDER SURGERY    . TONSILLECTOMY          Home Medications    Prior to Admission medications   Medication Sig Start Date End Date Taking? Authorizing Provider  allopurinol (ZYLOPRIM) 300 MG tablet Take 300 mg by mouth daily.   Yes [provider]  baclofen (LIORESAL) 20 MG tablet Take 20 mg by mouth 2 (two) times daily.    Yes [provider]    cholecalciferol (VITAMIN D3) 25 MCG (1000 UT) tablet Take 1,000 Units by mouth daily.   Yes [provider]  gabapentin (NEURONTIN) 100 MG capsule Take 100 mg by mouth 3 (three) times daily as needed (nerve pain).    Yes [provider]  HYDROcodone-acetaminophen (NORCO) 7.5-325 MG tablet Take 1 tablet by mouth every 6 (six) hours as needed for moderate pain.   Yes [provider]  lisinopril-hydrochlorothiazide (PRINZIDE,ZESTORETIC) 20-12.5 MG tablet Take 1 tablet by mouth daily.   Yes [provider]  Omega-3 Fatty Acids (FISH OIL) 1000 MG CAPS Take 1 capsule by mouth daily.   Yes [provider]  rosuvastatin (CRESTOR) 10 MG tablet Take 10 mg by mouth every other day.    Yes [provider]    Family History History reviewed. No pertinent family history.  Social History Social History   Tobacco Use  . Smoking status: Former Games developer  . Smokeless tobacco: Never Used  Substance Use Topics  . Alcohol use: Not Currently    Frequency: Never  . Drug use: Never     Allergies   Oxycodone   Review of Systems Review of Systems  Respiratory: Negative for shortness of breath.   Cardiovascular: Negative for chest pain.  Gastrointestinal: Negative for abdominal pain.  Neurological: Negative for headaches.     Physical Exam  Updated Vital Signs BP (!) 168/102 (BP Location: Left Arm)   Pulse 97   Temp 98.4 F (36.9 C) (Oral)   Resp 18   Ht 5' 11.5" (1.816 m)   Wt 106.6 kg   SpO2 100%   BMI 32.32 kg/m   Physical Exam Vitals signs and nursing note reviewed.  Constitutional:      Appearance: He is well-developed.  HENT:     Head: Normocephalic and atraumatic.  Eyes:     Pupils: Pupils are equal, round, and reactive to light.  Neck:     Musculoskeletal: Normal range of motion and neck supple.     Vascular: No JVD.  Cardiovascular:     Rate and Rhythm: Normal rate and regular rhythm.     Heart sounds: No murmur. No  friction rub. No gallop.   Pulmonary:     Effort: No respiratory distress.     Breath sounds: No wheezing.  Abdominal:     General: There is no distension.     Tenderness: There is no guarding or rebound.  Musculoskeletal: Normal range of motion.  Skin:    Coloration: Skin is not pale.     Findings: No rash.  Neurological:     Mental Status: He is alert and oriented to person, place, and time.  Psychiatric:        Behavior: Behavior normal.      ED Treatments / Results  Labs (all labs ordered are listed, but only abnormal results are displayed) Labs Reviewed - No data to display  EKG None  Radiology Dg Chest 2 View  Result Date: 05/19/2018 CLINICAL DATA:  Cough after aspiration 2 days ago. EXAM: CHEST - 2 VIEW COMPARISON:  10/23/2017 FINDINGS: There is extension way shin of the cardiac silhouette which is most likely due to a shallow inspiration. Pulmonary vascularity is normal. Lungs are clear. No effusions. No acute bone abnormality. IMPRESSION: 1. no acute cardiopulmonary finding. 2. No evidence of aspiration. Electronically Signed   By: Francene BoyersJames  Maxwell M.D.   On: 05/19/2018 20:41    Procedures Procedures (including critical care time)  Medications Ordered in ED Medications - No data to display   Initial Impression / Assessment and Plan / ED Course  I have reviewed the triage vital signs and the nursing notes.  Pertinent labs & imaging results that were available during my care of the patient were reviewed by me and considered in my medical decision making (see chart for details).     57 yo M with a chief complaint of an aspirated foreign body.  Chest x-ray is clear.  Patient is well-appearing nontoxic.  Oxygen saturation is 100%.  I discussed with him return precautions.  PCP follow-up.  8:51 PM:  I have discussed the diagnosis/risks/treatment options with the patient and believe the pt to be eligible for discharge home to follow-up with PCP. We also discussed  returning to the ED immediately if new or worsening sx occur. We discussed the sx which are most concerning (e.g., sudden worsening pain, fever, inability to tolerate by mouth) that necessitate immediate return. Medications administered to the patient during their visit and any new prescriptions provided to the patient are listed below.  Medications given during this visit Medications - No data to display    The patient appears reasonably screen and/or stabilized for discharge and I doubt any other medical condition or other [facility] requiring further screening, evaluation, or treatment in the ED at this time prior to discharge.  Final Clinical Impressions(s) / ED Diagnoses   Final diagnoses:  Foreign body aspiration, initial encounter    ED Discharge Orders    None       Melene PlanFloyd, Abas Leicht, DO 05/19/18 2051

## 2018-05-19 NOTE — ED Notes (Signed)
Patient denies pain and is resting comfortably.  

## 2019-02-07 IMAGING — MR MR CERVICAL SPINE W/O CM
5 series · 29 of 48 positions shown · non-contrast
Comparison: None.

CLINICAL DATA: Neck pain and stiffness over the last 2 years.
Bilateral arm weakness and numbness.

EXAM:
MRI CERVICAL SPINE WITHOUT CONTRAST
TECHNIQUE: Multiplanar, multisequence MR imaging of the cervical spine was
performed. No intravenous contrast was administered.

[Series 6: T1 · sagittal · 3.0mm · 0.66mm/px · 6 of 15 slices shown]
[im 1/15]
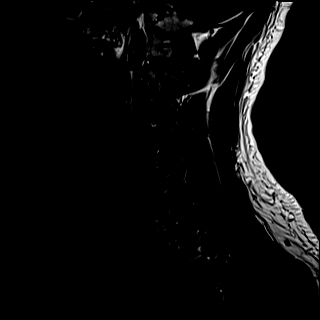
[im 3/15]
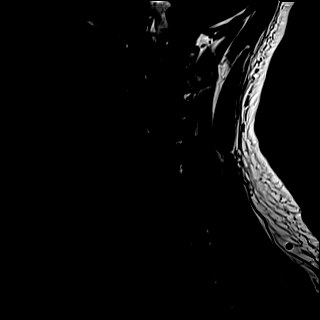
[im 6/15]
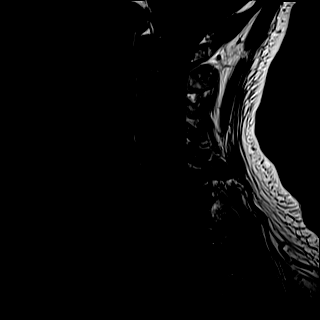
[im 9/15]
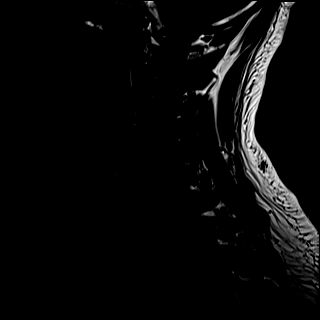
[im 12/15]
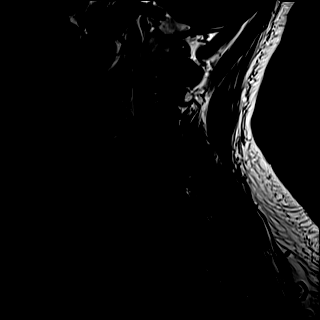
[im 15/15]
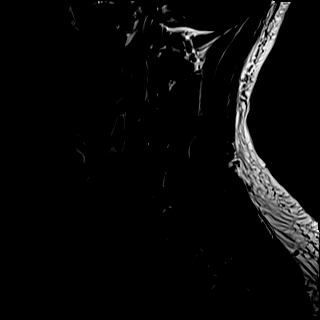

[Series 7: T2 · sagittal · 3.0mm · 0.55mm/px · 5 of 15 slices shown (1 of 2)]
[im 1/15]
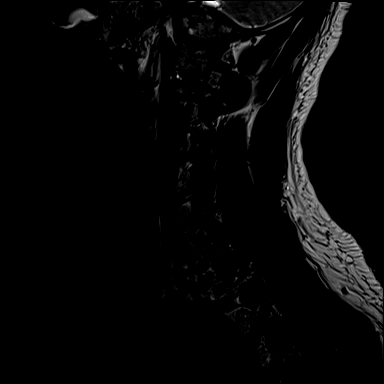
[im 4/15]
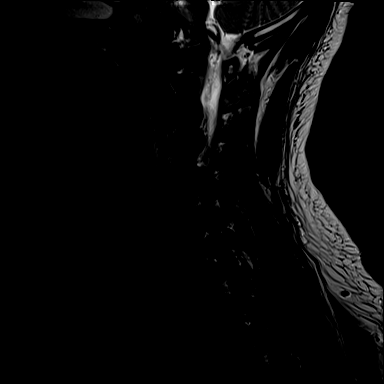
[im 8/15]
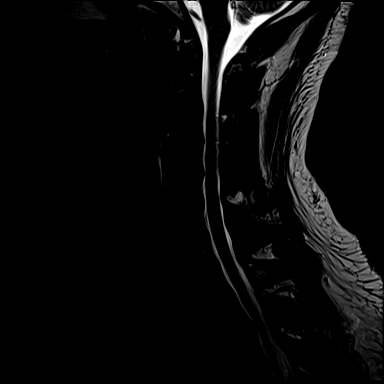
[im 11/15]
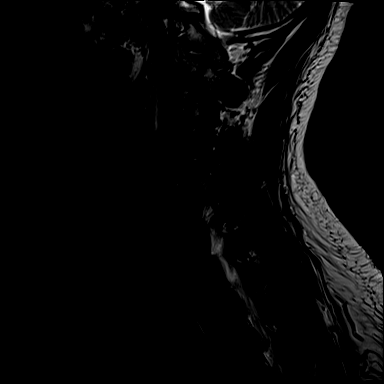
[im 15/15]
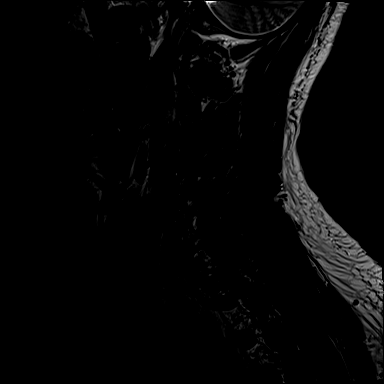

[Series 8: STIR · sagittal · 3.0mm · 0.33mm/px · 5 of 15 slices shown]
[im 1/15]
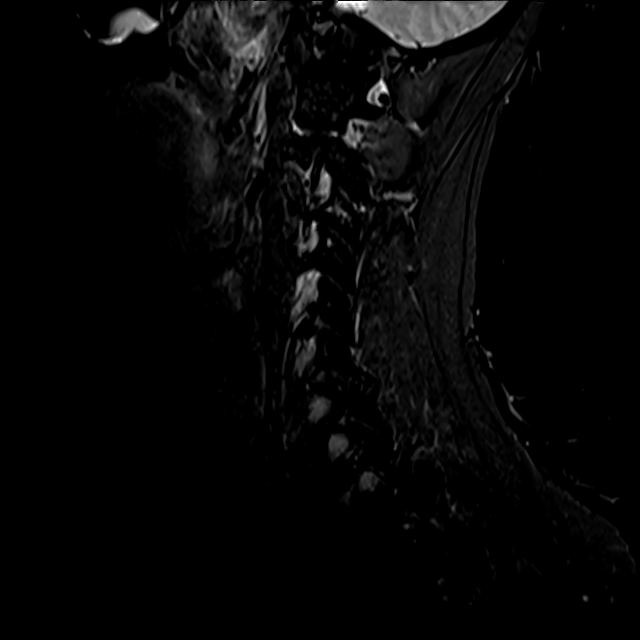
[im 4/15]
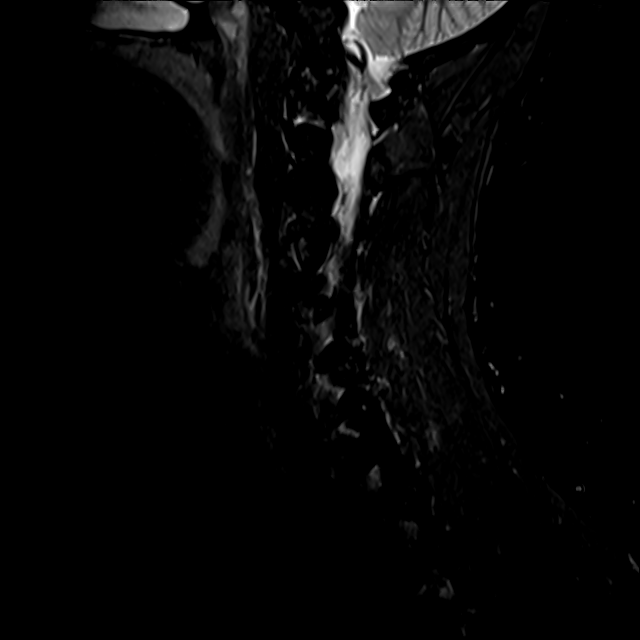
[im 8/15]
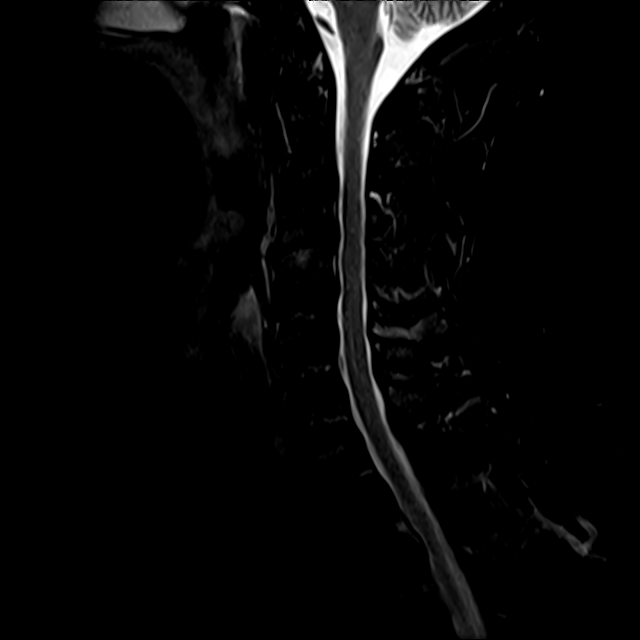
[im 11/15]
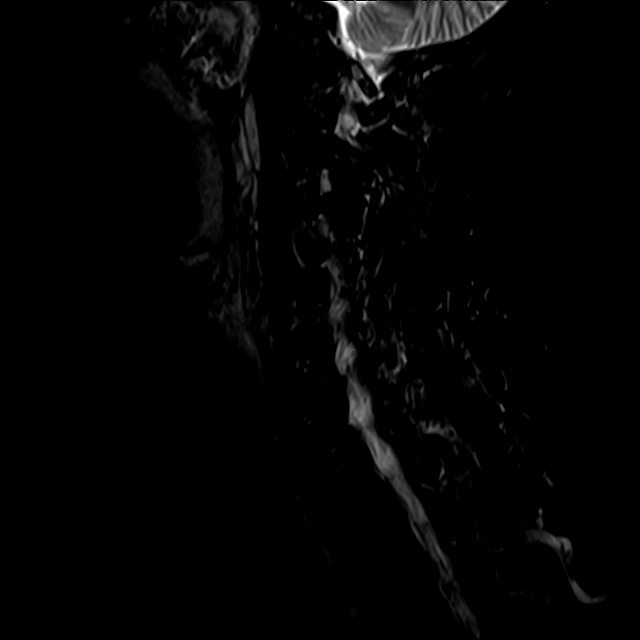
[im 15/15]
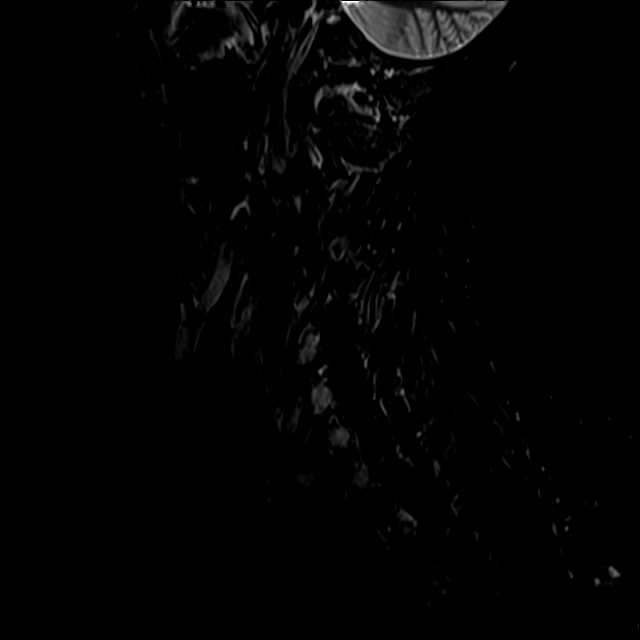

[Series 9: T2 · axial · 3.0mm · 0.50mm/px · z∈[-133,+4]mm · 10 of 45 slices shown (2 of 2)]
[im 3/45]
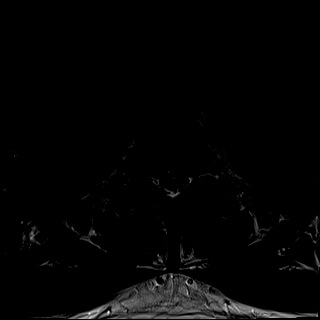
[im 6/45]
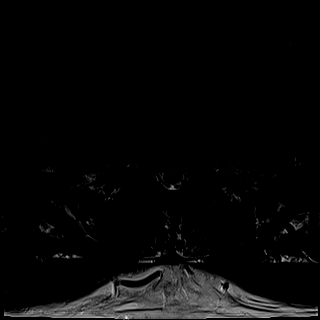
[im 9/45]
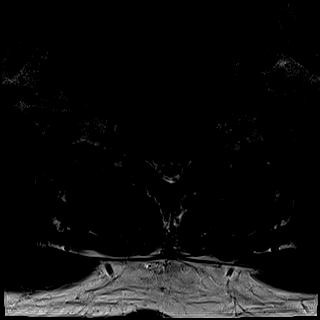
[im 15/45]
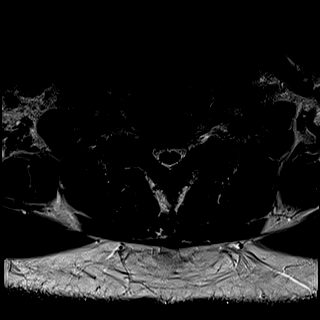
[im 21/45]
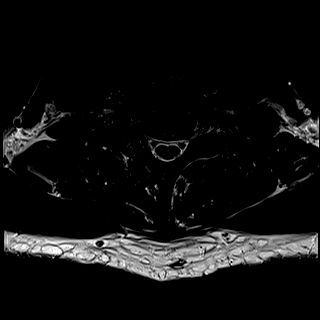
[im 24/45]
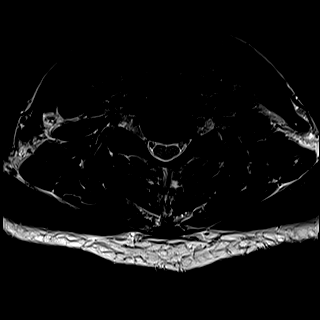
[im 27/45]
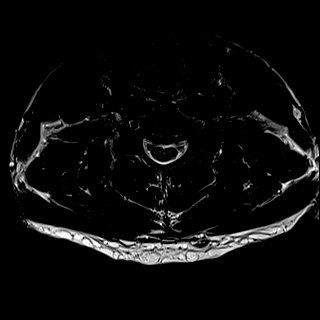
[im 33/45]
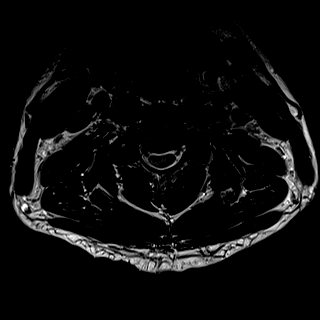
[im 39/45]
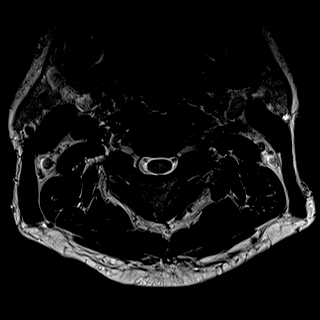
[im 45/45]
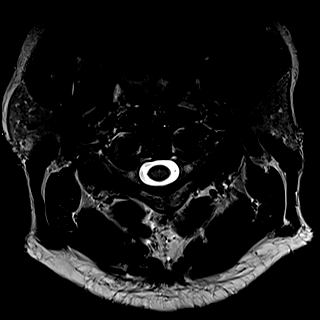

[Series 10: GRE · axial · 3.0mm · 0.42mm/px · z∈[-133,-94]mm · 3 of 45 slices shown]
[im 3/45]
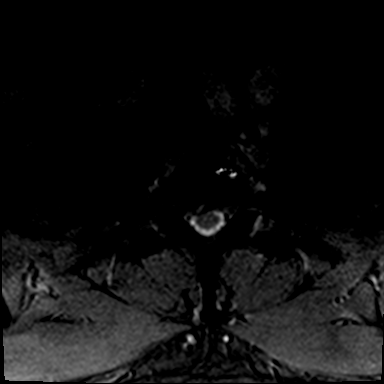
[im 9/45]
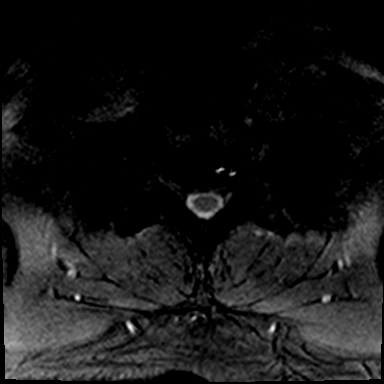
[im 15/45]
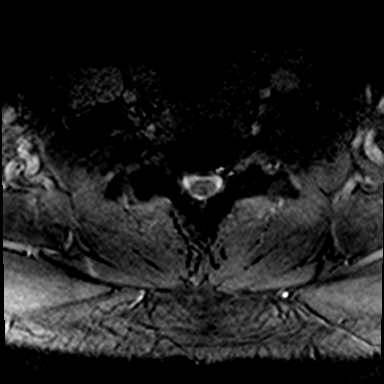

[29 of 48 positions shown; findings below may reference images not displayed]

FINDINGS: Alignment: Normal

Vertebrae: No fracture or primary bone lesion.

Cord: No cord compression or primary cord lesion.

Posterior Fossa, vertebral arteries, paraspinal tissues: Negative

Disc levels:

Foramen magnum is widely patent. Ordinary mild osteoarthritis at the
C1-2 articulation without encroachment upon the neural spaces.

C2-3: Mild bulging of the disc.  No canal or foraminal stenosis.

C3-4: Spondylosis with endplate osteophytes and bulging of the disc
more towards the right. Narrowing of the ventral subarachnoid space
but no compressive effect upon the cord. AP diameter of the canal 9
mm at this level. Foraminal encroachment by osteophytes right more
than left. Some potential to affect either C4 nerve.

C4-5: Spondylosis with endplate osteophytes and bulging of the disc.
Narrowing of the ventral subarachnoid space but no compression of
the cord. AP diameter of the canal 9.5 mm. Mild foraminal narrowing
on the right and slightly more pronounced foraminal narrowing on the
left. Some potential this could affect the left C5 nerve.

C5-6: Minimal disc bulge.  No stenosis.

C6-7: Minimal disc bulge. Facet osteoarthritis on the right with
mild edema. This could be associated with neck pain. Foraminal
narrowing on the right because of osteophytic encroachment could
affect the right C7 nerve.

C7-T1: Normal interspace.

T1-2 is negative. T2-3 shows a shallow disc protrusion that narrows
the ventral subarachnoid space but does not appear to cause neural
compression.
IMPRESSION: Degenerative spondylosis most pronounced at C3-4 and C4-5. Bilateral
foraminal narrowing at C3-4 and left more than right at C4-5. Facet
arthropathy on the right at C6-7 which could be a cause of neck
pain. Some foraminal encroachment upon the foramen on the right at
this level could affect the right C7 nerve.
